# Patient Record
Sex: Male | Born: 1945 | Race: White | Hispanic: Yes | Marital: Married | Smoking: Former smoker
Health system: Southern US, Community
[De-identification: ages and names within clinical notes are randomized; demographics above are authoritative.]

## PROBLEM LIST (undated history)

## (undated) DIAGNOSIS — E785 Hyperlipidemia, unspecified: Secondary | ICD-10-CM

## (undated) DIAGNOSIS — Z87442 Personal history of urinary calculi: Secondary | ICD-10-CM

## (undated) DIAGNOSIS — N200 Calculus of kidney: Secondary | ICD-10-CM

## (undated) DIAGNOSIS — I2510 Atherosclerotic heart disease of native coronary artery without angina pectoris: Secondary | ICD-10-CM

## (undated) DIAGNOSIS — C44311 Basal cell carcinoma of skin of nose: Secondary | ICD-10-CM

## (undated) DIAGNOSIS — I10 Essential (primary) hypertension: Secondary | ICD-10-CM

## (undated) DIAGNOSIS — B029 Zoster without complications: Secondary | ICD-10-CM

## (undated) DIAGNOSIS — Z8619 Personal history of other infectious and parasitic diseases: Secondary | ICD-10-CM

## (undated) HISTORY — DX: Personal history of other infectious and parasitic diseases: Z86.19

## (undated) HISTORY — DX: Basal cell carcinoma of skin of nose: C44.311

## (undated) HISTORY — DX: Zoster without complications: B02.9

## (undated) HISTORY — DX: Personal history of urinary calculi: Z87.442

---

## 1970-03-03 HISTORY — PX: TONSILLECTOMY AND ADENOIDECTOMY: SUR1326

## 2003-03-24 ENCOUNTER — Ambulatory Visit (HOSPITAL_COMMUNITY): Admission: RE | Admit: 2003-03-24 | Discharge: 2003-03-24 | Payer: Self-pay | Admitting: Neurosurgery

## 2004-01-17 LAB — HM COLONOSCOPY: HM COLON: NORMAL

## 2004-03-26 ENCOUNTER — Ambulatory Visit: Payer: Self-pay | Admitting: Internal Medicine

## 2007-03-04 HISTORY — PX: VASECTOMY: SHX75

## 2008-05-04 ENCOUNTER — Encounter: Payer: Self-pay | Admitting: Internal Medicine

## 2009-03-03 DIAGNOSIS — B029 Zoster without complications: Secondary | ICD-10-CM

## 2009-03-03 HISTORY — DX: Zoster without complications: B02.9

## 2009-05-24 ENCOUNTER — Encounter: Payer: Self-pay | Admitting: Internal Medicine

## 2009-10-19 ENCOUNTER — Ambulatory Visit: Payer: Self-pay | Admitting: Internal Medicine

## 2009-10-19 DIAGNOSIS — B03 Smallpox: Secondary | ICD-10-CM | POA: Insufficient documentation

## 2009-10-19 DIAGNOSIS — Z87442 Personal history of urinary calculi: Secondary | ICD-10-CM

## 2009-10-19 DIAGNOSIS — B029 Zoster without complications: Secondary | ICD-10-CM | POA: Insufficient documentation

## 2009-10-19 DIAGNOSIS — C4490 Unspecified malignant neoplasm of skin, unspecified: Secondary | ICD-10-CM

## 2009-10-19 DIAGNOSIS — Z87898 Personal history of other specified conditions: Secondary | ICD-10-CM

## 2009-10-20 ENCOUNTER — Encounter: Payer: Self-pay | Admitting: Internal Medicine

## 2010-04-02 NOTE — Letter (Signed)
Summary: Generic Letter  [facility]  [address] [place], [place] [address]   Phone: [phone]  Fax: [phone]    10/20/2009  John Stein [address] [place], [place]  [address]  Dear [name],           Sincerely,   Illene Regulus MD

## 2010-04-02 NOTE — Letter (Signed)
Summary: Generic Letter  [facility]  [address] [place], [place] [address]   Phone: [phone]  Fax: [phone]          10/20/2009  John Stein [address] [place], [place]  [address]  Dear [name],  Thanks for coming to see me. I hope that your rash and sinus congestion are both improved.  I appreciate receiving the lab report. Your HDL @ 44 is good and the LDL @ 135 is very close to NCEP goal of 130 or less. Your ten year risk of a cardiac event is 12% using the NIH/Framingham risk calculator with low risk being 1-10%, moderate risk 10-20%, high risk greater than 20%. I have no recommendations except to continue gardening and exercising.   Sincerely,   Illene Regulus MD

## 2010-04-02 NOTE — Assessment & Plan Note (Signed)
Summary: PER AMY PER MD OK--COUGH FEVER CONGESTION--# PKG/OFF--STC   Vital Signs:  Patient profile:   65 year old male Height:      69 inches Weight:      177 pounds BMI:     26.23 O2 Sat:      97 % on Room air Temp:     98.2 degrees F oral Pulse rate:   76 / minute BP sitting:   120 / 78  (left arm) Cuff size:   regular  Vitals Entered By: Bill Salinas CMA (October 19, 2009 8:57 AM)  O2 Flow:  Room air CC: pt here with c/o rash on his head. productive cough x 2 weeks and sinus pressure/ ab   Primary Care Provider:  Jacques Navy MD  CC:  pt here with c/o rash on his head. productive cough x 2 weeks and sinus pressure/ ab.  History of Present Illness: Presents with a five day history of rash on the vertex scalp  which extends down the left side to behind the ear and has early erythematous papular rash on the left neck. The rash is uncomfortable. He has had shingles before in a thoracic distribution and this feels similar.  He has a 2-3 week history of cough and sinus congestion. The mucus varies from white to highly colored. He has had a chest x-ray that was read out as normal. He denies fever or chills.  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 [place] '84 Married 10 yrs - divorced; Married '90 2 sons - '93, '02; 2 daughters - '91, '96 Practicing Neurosurgeon  Review of Systems   The patient complains of prolonged cough and headaches.  The patient denies anorexia, fever, weight loss, weight gain, decreased hearing, chest pain, syncope, dyspnea on exertion, hemoptysis, abdominal pain, severe indigestion/heartburn, muscle weakness, difficulty walking, and enlarged lymph nodes.    Physical Exam  General:  Atheletic and fit appearing male in no distress Head:  normocephalic and atraumatic.  Small indentation left of center on forehead. Tender to percussion over the frontal and maxillary sinus Eyes:  pupils equal and pupils round.  C&S clear Neck:  supple.   Lungs:  normal respiratory effort, no intercostal retractions, no accessory muscle use, normal breath sounds, no dullness, no crackles, and no wheezes.  Good breath sounds throughout Heart:  normal rate and regular rhythm.   Pulses:  2+ radial, regular Neurologic:  alert & oriented X3 and gait normal.   Skin:  raised macular-papular rash left of vertex scalp with additional lesion left posterior-parietal region and extending down the left lateral neck. No open vesicles Psych:  Oriented X3, normally interactive, and good eye contact.     Impression & Recommendations:  Problem # 1:  SHINGLES, RECURRENT (ICD-053.9)  scalp rash with extension strongly suggestive of shingles. The rash is painful. He has had this for 5 days   Plan - valtrex 1000mg  three times a day x 7. He is aware of  decreased efficacy after 72 hrs of the outbreak           prednisone 20mg  once daily x 7           for persistent or worsening rash - dermatology consult  Orders: No Charge Patient Arrived (NCPA0) (NCPA0)  Problem # 2:  UNSPECIFIED SINUSITIS (ICD-473.9)  Drainage, pressure and tenderness. Lungs are very clear  Plan Augmentin 875mg  two times a day x 7        otc sudafed 30mg  two times a day        nasal saline wash  His updated medication list for this problem includes:    Amoxicillin-pot Clavulanate 875-125 Mg Tabs  (Amoxicillin-pot clavulanate) .Marland Kitchen... 1 by mouth two times a day  Orders: No Charge Patient Arrived (NCPA0) (NCPA0)  Problem # 3:  Preventive Health Care (ICD-V70.0) Appears very fit. He has had colonoscopy approx 5 years ago - normal study, thus due for follow-up at 10 years. He has had recent labs- results will be forwarded. Chart reviewed: in '06 LDL 129, HDL 38, glucose 113. He is fully immunized. He has had PSA screening.  Complete Medication List: 1)  Prednisone 20 Mg Tabs (Prednisone) .Marland Kitchen.. 1 by mouth once daily 2)  Amoxicillin-pot Clavulanate 875-125 Mg Tabs (Amoxicillin-pot clavulanate) .Marland Kitchen.. 1 by mouth two times a day 3)  Valtrex 1 Gm Tabs (Valacyclovir hcl) .Marland Kitchen.. 1 by mouth three times a day x 7 Prescriptions: VALTREX 1 GM TABS (VALACYCLOVIR HCL) 1 by mouth three times a day x 7  #21 x 0   Entered and Authorized by:   Jacques Navy MD   Signed by:   Jacques Navy MD on 10/19/2009   Method used:   Electronically to        [facility] [address] [phone]* (retail)       [address]       [place], [place]  [address]       Ph: [phone] or [phone]       Fax: [phone]   RxID:   [id number] AMOXICILLIN-POT CLAVULANATE 875-125 MG TABS (AMOXICILLIN-POT CLAVULANATE) 1 by mouth two times a day  #14 x 0   Entered and Authorized by:   Jacques Navy MD   Signed by:   Jacques Navy MD on 10/19/2009   Method used:   Electronically to        [facility] [address] [phone]* (retail)       [address]       [place], [place]  [address]       Ph: [phone] or [phone]       Fax: [phone]   RxID:   [id number] PREDNISONE 20 MG TABS (PREDNISONE) 1 by mouth once daily  #7 x 0   Entered and Authorized by:   Jacques Navy MD   Signed by:   Jacques Navy MD on 10/19/2009   Method used:   Electronically to        [facility] [address] [phone]* (retail)       [address]       [place],  [place]  [address]       Ph: [phone] or [phone]       Fax: [phone]   RxID:   [id number]

## 2011-05-14 ENCOUNTER — Ambulatory Visit: Payer: Self-pay | Admitting: Internal Medicine

## 2011-12-01 ENCOUNTER — Ambulatory Visit (INDEPENDENT_AMBULATORY_CARE_PROVIDER_SITE_OTHER): Payer: No Typology Code available for payment source | Admitting: Internal Medicine

## 2011-12-01 ENCOUNTER — Other Ambulatory Visit (INDEPENDENT_AMBULATORY_CARE_PROVIDER_SITE_OTHER): Payer: No Typology Code available for payment source

## 2011-12-01 ENCOUNTER — Encounter: Payer: Self-pay | Admitting: Internal Medicine

## 2011-12-01 VITALS — BP 142/88 | HR 79 | Temp 97.5°F | Resp 16 | Wt 185.0 lb

## 2011-12-01 DIAGNOSIS — R209 Unspecified disturbances of skin sensation: Secondary | ICD-10-CM

## 2011-12-01 DIAGNOSIS — Z125 Encounter for screening for malignant neoplasm of prostate: Secondary | ICD-10-CM

## 2011-12-01 DIAGNOSIS — Z13220 Encounter for screening for lipoid disorders: Secondary | ICD-10-CM

## 2011-12-01 DIAGNOSIS — Z0000 Encounter for general adult medical examination without abnormal findings: Secondary | ICD-10-CM | POA: Insufficient documentation

## 2011-12-01 DIAGNOSIS — B029 Zoster without complications: Secondary | ICD-10-CM

## 2011-12-01 DIAGNOSIS — M79601 Pain in right arm: Secondary | ICD-10-CM

## 2011-12-01 DIAGNOSIS — R0789 Other chest pain: Secondary | ICD-10-CM

## 2011-12-01 LAB — LIPID PANEL
Cholesterol: 223 mg/dL — ABNORMAL HIGH (ref 0–200)
HDL: 50.2 mg/dL (ref >39.00)
Triglycerides: 83 mg/dL (ref 0.0–149.0)
VLDL: 16.6 mg/dL (ref 0.0–40.0)

## 2011-12-01 LAB — COMPREHENSIVE METABOLIC PANEL
AST: 23 U/L (ref 0–37)
BUN: 13 mg/dL (ref 6–23)
Calcium: 9.2 mg/dL (ref 8.4–10.5)
Chloride: 103 mEq/L (ref 96–112)
Creatinine, Ser: 0.8 mg/dL (ref 0.4–1.5)
Total Bilirubin: 0.7 mg/dL (ref 0.3–1.2)

## 2011-12-01 LAB — LDL CHOLESTEROL, DIRECT: Direct LDL: 156.5 mg/dL

## 2011-12-01 NOTE — Assessment & Plan Note (Signed)
Interval history significant for paresthesia both UE; significant for atypical chest pain. Limited physical exam is normal. Labs pending - Lipid panel, Bmet, PSA. He is current (we think) with colorectal cancer screening and his last PSA in '11 was 1.52.  In summary - a wonderful colleague who is generally in good health. Will need risk stratification in regard to atypical chest pain and cervical imaging in regard to bilateral UE paresthesia.
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
## 2011-12-01 NOTE — Assessment & Plan Note (Signed)
discussed recommendations for immunization, even in patients with h/o shingles - he will give this consideration

## 2011-12-07 ENCOUNTER — Encounter: Payer: Self-pay | Admitting: Internal Medicine

## 2011-12-07 DIAGNOSIS — E7800 Pure hypercholesterolemia, unspecified: Secondary | ICD-10-CM

## 2011-12-09 ENCOUNTER — Encounter (HOSPITAL_COMMUNITY): Payer: No Typology Code available for payment source

## 2011-12-11 ENCOUNTER — Other Ambulatory Visit: Payer: Self-pay | Admitting: Internal Medicine

## 2011-12-11 ENCOUNTER — Encounter (HOSPITAL_COMMUNITY)
Admission: RE | Disposition: A | Discharge status: Home / Self Care | Payer: Self-pay | Source: Ambulatory Visit | Attending: Cardiovascular Disease

## 2011-12-11 ENCOUNTER — Encounter (HOSPITAL_COMMUNITY): Payer: Self-pay | Admitting: General Practice

## 2011-12-11 ENCOUNTER — Ambulatory Visit (HOSPITAL_BASED_OUTPATIENT_CLINIC_OR_DEPARTMENT_OTHER): Payer: No Typology Code available for payment source | Admitting: Radiology

## 2011-12-11 ENCOUNTER — Encounter (HOSPITAL_COMMUNITY): Payer: Self-pay | Admitting: Internal Medicine

## 2011-12-11 ENCOUNTER — Ambulatory Visit (HOSPITAL_COMMUNITY)
Admission: RE | Admit: 2011-12-11 | Discharge: 2011-12-12 | Disposition: A | Discharge status: Home / Self Care | Payer: No Typology Code available for payment source | Source: Ambulatory Visit | Attending: Cardiovascular Disease | Admitting: Cardiovascular Disease

## 2011-12-11 VITALS — BP 127/79 | Ht 68.5 in | Wt 185.0 lb

## 2011-12-11 DIAGNOSIS — I200 Unstable angina: Secondary | ICD-10-CM

## 2011-12-11 DIAGNOSIS — I2510 Atherosclerotic heart disease of native coronary artery without angina pectoris: Secondary | ICD-10-CM

## 2011-12-11 DIAGNOSIS — Z7902 Long term (current) use of antithrombotics/antiplatelets: Secondary | ICD-10-CM | POA: Insufficient documentation

## 2011-12-11 DIAGNOSIS — R072 Precordial pain: Secondary | ICD-10-CM

## 2011-12-11 DIAGNOSIS — Z23 Encounter for immunization: Secondary | ICD-10-CM | POA: Insufficient documentation

## 2011-12-11 DIAGNOSIS — R0789 Other chest pain: Secondary | ICD-10-CM

## 2011-12-11 DIAGNOSIS — E785 Hyperlipidemia, unspecified: Secondary | ICD-10-CM | POA: Insufficient documentation

## 2011-12-11 DIAGNOSIS — I209 Angina pectoris, unspecified: Secondary | ICD-10-CM | POA: Insufficient documentation

## 2011-12-11 DIAGNOSIS — R079 Chest pain, unspecified: Secondary | ICD-10-CM

## 2011-12-11 DIAGNOSIS — I10 Essential (primary) hypertension: Secondary | ICD-10-CM | POA: Insufficient documentation

## 2011-12-11 DIAGNOSIS — I4949 Other premature depolarization: Secondary | ICD-10-CM

## 2011-12-11 HISTORY — DX: Essential (primary) hypertension: I10

## 2011-12-11 HISTORY — PX: LEFT HEART CATHETERIZATION WITH CORONARY ANGIOGRAM: SHX5451

## 2011-12-11 HISTORY — DX: Hyperlipidemia, unspecified: E78.5

## 2011-12-11 HISTORY — DX: Atherosclerotic heart disease of native coronary artery without angina pectoris: I25.10

## 2011-12-11 HISTORY — DX: Calculus of kidney: N20.0

## 2011-12-11 HISTORY — PX: CORONARY ANGIOPLASTY: SHX604

## 2011-12-11 LAB — CBC
Hemoglobin: 14.7 g/dL (ref 13.0–17.0)
MCH: 31.1 pg (ref 26.0–34.0)
MCHC: 35.4 g/dL (ref 30.0–36.0)
MCV: 87.9 fL (ref 78.0–100.0)

## 2011-12-11 LAB — BASIC METABOLIC PANEL
BUN: 15 mg/dL (ref 6–23)
Calcium: 9.1 mg/dL (ref 8.4–10.5)
GFR calc non Af Amer: >90 mL/min (ref >90)
Glucose, Bld: 108 mg/dL — ABNORMAL HIGH (ref 70–99)
Potassium: 4 mEq/L (ref 3.5–5.1)

## 2011-12-11 LAB — PROTIME-INR: Prothrombin Time: 12.5 seconds (ref 11.6–15.2)

## 2011-12-11 SURGERY — LEFT HEART CATHETERIZATION WITH CORONARY ANGIOGRAM
Anesthesia: LOCAL

## 2011-12-11 MED ORDER — ATORVASTATIN CALCIUM 80 MG PO TABS
80.0000 mg | ORAL_TABLET | Freq: Every day | ORAL | Status: DC
  Filled 2011-12-11: qty 1

## 2011-12-11 MED ORDER — ASPIRIN 81 MG PO CHEW
81.0000 mg | CHEWABLE_TABLET | Freq: Every day | ORAL | Status: DC
  Administered 2011-12-12: 10:00:00 81 mg via ORAL
  Filled 2011-12-11: qty 1

## 2011-12-11 MED ORDER — LIDOCAINE HCL (PF) 1 % IJ SOLN
INTRAMUSCULAR | Status: AC
  Filled 2011-12-11: qty 30

## 2011-12-11 MED ORDER — OXYCODONE-ACETAMINOPHEN 5-325 MG PO TABS: 1.0000 | ORAL_TABLET | ORAL | Status: DC | PRN

## 2011-12-11 MED ORDER — MIDAZOLAM HCL 2 MG/2ML IJ SOLN
INTRAMUSCULAR | Status: AC
  Filled 2011-12-11: qty 2

## 2011-12-11 MED ORDER — BIVALIRUDIN 250 MG IV SOLR
INTRAVENOUS | Status: AC
  Filled 2011-12-11: qty 250

## 2011-12-11 MED ORDER — FENTANYL CITRATE 0.05 MG/ML IJ SOLN
INTRAMUSCULAR | Status: AC
  Filled 2011-12-11: qty 2

## 2011-12-11 MED ORDER — ASPIRIN 81 MG PO CHEW: 324.0000 mg | CHEWABLE_TABLET | ORAL | Status: DC

## 2011-12-11 MED ORDER — ACETAMINOPHEN 325 MG PO TABS: 650.0000 mg | ORAL_TABLET | ORAL | Status: DC | PRN

## 2011-12-11 MED ORDER — ONDANSETRON HCL 4 MG/2ML IJ SOLN: 4.0000 mg | Freq: Four times a day (QID) | INTRAMUSCULAR | Status: DC | PRN

## 2011-12-11 MED ORDER — CLOPIDOGREL BISULFATE 300 MG PO TABS
ORAL_TABLET | ORAL | Status: AC
  Filled 2011-12-11: qty 2

## 2011-12-11 MED ORDER — SODIUM CHLORIDE 0.9 % IV SOLN: INTRAVENOUS | Status: AC

## 2011-12-11 MED ORDER — CLOPIDOGREL BISULFATE 75 MG PO TABS
75.0000 mg | ORAL_TABLET | Freq: Every day | ORAL | Status: DC
  Administered 2011-12-12: 10:00:00 75 mg via ORAL
  Filled 2011-12-11: qty 1

## 2011-12-11 MED ORDER — VERAPAMIL HCL 2.5 MG/ML IV SOLN
INTRAVENOUS | Status: AC
  Filled 2011-12-11: qty 2

## 2011-12-11 MED ORDER — TECHNETIUM TC 99M SESTAMIBI GENERIC - CARDIOLITE
33.0000 | Freq: Once | INTRAVENOUS | Status: AC | PRN
  Administered 2011-12-11: 33 via INTRAVENOUS

## 2011-12-11 MED ORDER — HEPARIN SODIUM (PORCINE) 1000 UNIT/ML IJ SOLN
INTRAMUSCULAR | Status: AC
  Filled 2011-12-11: qty 1

## 2011-12-11 MED ORDER — TECHNETIUM TC 99M SESTAMIBI GENERIC - CARDIOLITE
11.0000 | Freq: Once | INTRAVENOUS | Status: AC | PRN
  Administered 2011-12-11: 11 via INTRAVENOUS

## 2011-12-11 MED ORDER — NITROGLYCERIN 0.2 MG/ML ON CALL CATH LAB
INTRAVENOUS | Status: AC
  Filled 2011-12-11: qty 1

## 2011-12-11 MED ORDER — DIAZEPAM 5 MG PO TABS
5.0000 mg | ORAL_TABLET | ORAL | Status: AC
  Administered 2011-12-11: 5 mg via ORAL
  Filled 2011-12-11: qty 1

## 2011-12-11 MED ORDER — SODIUM CHLORIDE 0.9 % IV SOLN
INTRAVENOUS | Status: DC
  Administered 2011-12-11: 13:00:00 via INTRAVENOUS

## 2011-12-11 NOTE — Interval H&P Note (Signed)
History and Physical Interval Note:  12/11/2011 5:33 PM  John Stein  has presented today for surgery, with the diagnosis of chest pain  The various methods of treatment have been discussed with the patient and family. After consideration of risks, benefits and other options for treatment, the patient has consented to  Procedure(s) (LRB) with comments: LEFT HEART CATHETERIZATION WITH CORONARY ANGIOGRAM (N/A) as a surgical intervention .  The patient's history has been reviewed, patient examined, no change in status, stable for surgery.  I have reviewed the patient's chart and labs.  Questions were answered to the patient's satisfaction.     Tonny Bollman

## 2011-12-11 NOTE — Progress Notes (Signed)
 History and Physical  Patient ID: John Stein MRN: [id number], SOB: 09/19/1945 66 y.o. Date of Encounter: 12/11/2011, 12:08 PM  Primary Physician: Michael Norins, MD Primary Cardiologist:  new  Chief Complaint: abnormal stress  History of Present Illness: Malakhi Sagona is a 66 y.o. male  seen following abnormal stress test this morning. He has been having for about a year and more recently of late chest tightness which has been increasingly heavy. There has been radiation into his jaw. It occasionally occurs with exertion but also with stress particularly in the operating room.  Has been associated with some diaphoresis.  His no family history. Lipids last week  demonstrated an LDL of 156 with an HDL of 50. He does not smoke.  Past Medical History  Diagnosis Date  . 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 Shingles rash 2011    right scalp  . History of nephrolithiasis   . History of smallpox childhood     Past Surgical History  Procedure Date  . Tonsillectomy remote  . Adenoidectomy remote      No current outpatient prescriptions on file.   No current facility-administered medications for this visit.   Facility-Administered Medications Ordered in Other Visits  Medication Dose Route Frequency Provider Last Rate Last Dose  . technetium sestamibi generic (CARDIOLITE) injection 11 milli Curie  11 milli Curie Intravenous Once PRN Dalton S McLean, MD   11 milli Curie at 12/11/11 0825  . technetium sestamibi generic (CARDIOLITE) injection 33 milli Curie  33 milli Curie Intravenous Once PRN Dalton S McLean, MD   33 milli Curie at 12/11/11 1000     Allergies: No Known Allergies   History  Substance Use Topics  . Smoking status: Never Smoker   . Smokeless tobacco: Never Used  . Alcohol Use: Yes      Family History  Problem Relation Age of Onset  . Diabetes Neg Hx   . Hypertension Neg Hx   . Cancer Neg Hx       ROS:  Please see the history of present    All other systems reviewed and negative.   Vital Signs: There were no vitals taken for this visit. Vital signs demonstrated a blood pressure of 127/79 and a heart rate of 62  PHYSICAL EXAM: General:  Well nourished, well developed male in no acute distress  HEENT: normal Lymph: no adenopathy Neck: no JVD Endocrine:  No thryomegaly Vascular: No carotid bruits; FA pulses 2+ bilaterally without bruits Cardiac:  normal S1, S2; RRR; no murmur Back: without kyphosis/scoliosis, no CVA tenderness Lungs:  clear to auscultation bilaterally, no wheezing, rhonchi or rales Abd: soft, nontender, no hepatomegaly Ext: no edema Musculoskeletal:  No deformities, BUE and BLE strength normal and equal Skin: warm and dry Neuro:  CNs 2-12 intact, no focal abnormalities noted Psych:  Normal affect   EKG:   Sinus rhythm at 62 intervals 16/08/40 Otherwise normal  Treadmill demonstrated 2-3 mm of ST segment depression inferolateral that resolved with axis  Myoview pictures demonstrated anterolateral dropout of perfusion  Labs:   No results found for this basename: WBC, HGB, HCT, MCV, PLT   No results found for this basename: NA,K,CL,CO2,BUN,CREATININE,CALCIUM,LABALBU,PROT,BILITOT,ALKPHOS,ALT,AST,GLUCOSE in the last 168 hours No results found for this basename: CKTOTAL:4,CKMB:4,TROPONINI:4 in the last 72 hours Lab Results  Component Value Date   CHOL 223* 12/01/2011   HDL 50.20 12/01/2011   TRIG 83.0 12/01/2011   No results found for this basename: DDIMER   BNP No   results found for this basename: probnp       ASSESSMENT AND PLAN:   Abnormal stress test; the patient presented with chest pain. He has elevated lipids. Underwent Myoview scanning today which was markedly abnormal. I have reviewed the above results with the patient. We will plan to refer to the hospital for catheterization and likely intervention. We will give him aspirin. He will need statin therapy for secondary prevention.    

## 2011-12-11 NOTE — H&P (View-Only) (Signed)
History and Physical  Patient ID: John Stein MRN: [id number], SOB: Oct 21, 1945 66 y.o. Date of Encounter: 12/11/2011, 12:08 PM  Primary Physician: Illene Regulus, MD Primary Cardiologist:  new  Chief Complaint: abnormal stress  History of Present Illness: John Stein is a 66 y.o. male  seen following abnormal stress test this morning. He has been having for about a year and more recently of late chest tightness which has been increasingly heavy. There has been radiation into his jaw. It occasionally occurs with exertion but also with stress particularly in the operating room.  Has been associated with some diaphoresis.  His no family history. Lipids last week  demonstrated an LDL of 156 with an HDL of 50. He does not smoke.  Past Medical History  Diagnosis Date  . 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 Shingles rash 2011    right scalp  . History of nephrolithiasis   . History of smallpox childhood     Past Surgical History  Procedure Date  . Tonsillectomy remote  . Adenoidectomy remote      No current outpatient prescriptions on file.   No current facility-administered medications for this visit.   Facility-Administered Medications Ordered in Other Visits  Medication Dose Route Frequency Provider Last Rate Last Dose  . technetium sestamibi generic (CARDIOLITE) injection 11 milli Curie  11 milli Curie Intravenous Once PRN Laurey Morale, MD   11 milli Curie at 12/11/11 0825  . technetium sestamibi generic (CARDIOLITE) injection 33 milli Curie  33 milli Curie Intravenous Once PRN Laurey Morale, MD   33 milli Curie at 12/11/11 1000     Allergies: No Known Allergies   History  Substance Use Topics  . Smoking status: Never Smoker   . Smokeless tobacco: Never Used  . Alcohol Use: Yes      Family History  Problem Relation Age of Onset  . Diabetes Neg Hx   . Hypertension Neg Hx   . Cancer Neg Hx       ROS:  Please see the history of present    All other systems reviewed and negative.   Vital Signs: There were no vitals taken for this visit. Vital signs demonstrated a blood pressure of 127/79 and a heart rate of 62  PHYSICAL EXAM: General:  Well nourished, well developed male in no acute distress  HEENT: normal Lymph: no adenopathy Neck: no JVD Endocrine:  No thryomegaly Vascular: No carotid bruits; FA pulses 2+ bilaterally without bruits Cardiac:  normal S1, S2; RRR; no murmur Back: without kyphosis/scoliosis, no CVA tenderness Lungs:  clear to auscultation bilaterally, no wheezing, rhonchi or rales Abd: soft, nontender, no hepatomegaly Ext: no edema Musculoskeletal:  No deformities, BUE and BLE strength normal and equal Skin: warm and dry Neuro:  CNs 2-12 intact, no focal abnormalities noted Psych:  Normal affect   EKG:   Sinus rhythm at 62 intervals 16/08/40 Otherwise normal  Treadmill demonstrated 2-3 mm of ST segment depression inferolateral that resolved with axis  Myoview pictures demonstrated anterolateral dropout of perfusion  Labs:   No results found for this basename: WBC, HGB, HCT, MCV, PLT   No results found for this basename: NA,K,CL,CO2,BUN,CREATININE,CALCIUM,LABALBU,PROT,BILITOT,ALKPHOS,ALT,AST,GLUCOSE in the last 168 hours No results found for this basename: CKTOTAL:4,CKMB:4,TROPONINI:4 in the last 72 hours Lab Results  Component Value Date   CHOL 223* 12/01/2011   HDL 50.20 12/01/2011   TRIG 83.0 12/01/2011   No results found for this basename: DDIMER   BNP No  results found for this basename: probnp       ASSESSMENT AND PLAN:   Abnormal stress test; the patient presented with chest pain. He has elevated lipids. Underwent Myoview scanning today which was markedly abnormal. I have reviewed the above results with the patient. We will plan to refer to the hospital for catheterization and likely intervention. We will give him aspirin. He will need statin therapy for secondary prevention.

## 2011-12-11 NOTE — CV Procedure (Signed)
Cardiac Catheterization Procedure Note  Name: John Stein MRN: [id number] DOB: December 28, 1945  Procedure: Left Heart Cath, Selective Coronary Angiography, LV angiography, PTCA of the first diagonal branch of the LAD  Indication: Class 3 angina  Procedural Details:  The right wrist was prepped, draped, and anesthetized with 1% lidocaine. Using the modified Seldinger technique, a 5 French sheath was introduced into the right radial artery. 3 mg of verapamil was administered through the sheath, weight-based unfractionated heparin was administered intravenously. Standard Judkins catheters were used for selective coronary angiography and left ventriculography. Catheter exchanges were performed over an exchange length guidewire.  PROCEDURAL FINDINGS Hemodynamics: AO 144/80 LV 140/22   Coronary angiography: Coronary dominance: right  Left mainstem: Widely patent without obstructive disease  Left anterior descending (LAD): The LAD has proximal ectasia. There is no significant stenosis. The mid vessel at the origin of the first diagonal has 20-30% stenosis. There are diffuse irregularities throughout the remaining portions of the LAD. The first diagonal is large in caliber and it is subtotally occluded with a 99% ostial stenosis. The distal branch vessels of the diagonal fills competitively from antegrade and retrograde collateral flow from the right coronary artery.  Left circumflex (LCx): There is a small intermediate branch with no significant stenosis. The AV groove circumflex is widely patent in the proximal and mid portions. The vessel supplies a large atrial branch. Beyond the atrial branch and the first obtuse marginal there is 40% stenosis at the origin of the OM and then 60% stenosis in the mid body of the OM.  Right coronary artery (RCA): This is a large, dominant vessel. There is diffuse nonobstructive disease throughout. The vessel is widely patent through the proximal, mid, and distal  portions. The PDA is very large without significant stenosis. There are 2 small posterolateral branches without significant disease. There are collaterals supplying the distal branch vessels of the first diagonal peer  Left ventriculography: Left ventricular systolic function is preserved. The estimated left ventricular ejection fraction is 55%. There is subtle hypokinesis of the distal anterolateral wall.  PCI Note:  Following the diagnostic procedure, the decision was made to proceed with PCI. The radial sheath was upsized to a 6 [place]. Weight-based bivalirudin was given for anticoagulation. The patient was loaded with 600 mg of Plavix. Once a therapeutic ACT was achieved, a 6 [place] XB LAD 3.5 cm guide catheter was inserted.  A cougar coronary guidewire was initially used but I was unable to cross the lesion. I changed out to a fielder XT wire. This was successful at crossing the lesion.  The lesion was predilated with a 2.5 x 12 mm balloon.  The lesion was then dilated with a 2.5 x 10 mm cutting balloon.  Because ostial location of the lesion and the angulation as it originates from the LAD, I did not feel that stenting was an option.  Following PCI, there was 30% residual stenosis and TIMI-3 flow. Final angiography confirmed an excellent result. The patient tolerated the procedure well. There were no immediate procedural complications. A TR band was used for radial hemostasis. The patient was transferred to the post catheterization recovery area for further monitoring.  PCI Data: Vessel -  diagonal 1 /Segment -  ostial  Percent Stenosis (pre)   99  TIMI-flow  3 Stent  none  Percent Stenosis (post)  30  TIMI-flow (post)  3   Final Conclusions:   1. Severe diagonal stenosis treated successfully with balloon angioplasty 2. Moderate LCx stenosis 3. Mild  nonobstructive RCA and LAD stenoses 4. Preserved LV function  Recommendations:  Aggressive medical therapy.  Tonny Bollman 12/11/2011,  7:04 PM

## 2011-12-11 NOTE — Progress Notes (Addendum)
TR BAND REMOVAL  LOCATION:  right radial  DEFLATED PER PROTOCOL:  yes  TIME BAND OFF / DRESSING APPLIED:   2200  SITE UPON ARRIVAL:   Level 1  SITE AFTER BAND REMOVAL:  Level 1  REVERSE ALLEN'S TEST:    positive  CIRCULATION SENSATION AND MOVEMENT:  Within Normal Limits  yes  COMMENTS:  Pressure held for swelling

## 2011-12-11 NOTE — Progress Notes (Signed)
[facility] SITE 3 NUCLEAR MED [id number] [place] [address] [phone]  Cardiology Nuclear Med Study  John Stein is a 66 y.o. male     MRN : [id number]     DOB: 08/30/1945  Procedure Date: 12/11/2011  Nuclear Med Background Indication for Stress Test:  Evaluation for Ischemia History:  n/a Cardiac Risk Factors: Lipids  Symptoms:  Chest Pain and Palpitations   Nuclear Pre-Procedure Caffeine/Decaff Intake:  None > 12 hrs NPO After: 10:00pm   Lungs:  clear O2 Sat: 95% on room air. IV 0.9% NS with Angio Cath:  22g  IV Site: R Antecubital x 1, tolerated well IV Started by:  Irean Hong, RN  Chest Size (in):  42 Cup Size: n/a  Height: 5' 8.5" (1.74 m)  Weight:  185 lb (83.915 kg)  BMI:  Body mass index is 27.72 kg/(m^2). Tech Comments:  This patient walked on the treadmill for 12 minutes. He started having chest tightness, PVCS, and a very positive EKG. DOD S.Graciela Husbands was consulted and saw the patient today. He was sent for a cath today for further evaluation.    Nuclear Med Study 1 or 2 day study: 1 day  Stress Test Type:  Stress  Reading MD: Marca Ancona, MD  Order Authorizing Provider:  Illene Regulus, MD  Resting Radionuclide: Technetium 34m Sestamibi  Resting Radionuclide Dose: 11.0 mCi   Stress Radionuclide:  Technetium 41m Sestamibi  Stress Radionuclide Dose: 33.0 mCi           Stress Protocol Rest HR: 62 Stress HR: 134  Rest BP: 127/79 Stress BP: 183/83  Exercise Time (min): 12:00 METS: 13.40   Predicted Max HR: 155 bpm % Max HR: 86.45 bpm Rate Pressure Product: 62952   Dose of Adenosine (mg):  n/a Dose of Lexiscan: n/a mg  Dose of Atropine (mg): n/a Dose of Dobutamine: n/a mcg/kg/min (at max HR)  Stress Test Technologist: Milana Na, EMT-P  Nuclear Technologist:  Domenic Polite, CNMT     Rest Procedure:  Myocardial perfusion imaging was performed at rest 45 minutes following the intravenous administration of  Technetium 70m Sestamibi. Rest ECG: NSR - Normal EKG  Stress Procedure:  The patient performed treadmill exercise using a Bruce  Protocol for 12:00 minutes. The patient stopped due to fatigue and chest tightness that radiated to his (L) jaw.  There were + significant ST-T wave changes and freq pvcs.  Technetium 3m Tetrofosmin was injected at peak exercise and myocardial perfusion imaging was performed after a brief delay. Stress ECG: Significant ST abnormalities consistent with ischemia.  QPS Raw Data Images:  Normal; no motion artifact; normal heart/lung ratio. Stress Images:  Medium, severe anterior and mid to apical anterolateral perfusion defect.  Rest Images:  Normal homogeneous uptake in all areas of the myocardium. Subtraction (SDS):  Medium, severe reversible anterior and mid to apical anterolateral perfusion defect.  Transient Ischemic Dilatation (Normal <1.22):  1.07 Lung/Heart Ratio (Normal <0.45):  0.31  Quantitative Gated Spect Images QGS EDV:  139 ml QGS ESV:  65 ml  Impression Exercise Capacity:  Excellent exercise capacity. BP Response:  Normal blood pressure response. Clinical Symptoms:  Significant chest pain. ECG Impression:  3 mm horizontal ST depression V4-V6 persisting up to 7 minutes in recovery.  Some PVCs.  Comparison with Prior Nuclear Study: No images to compare  Overall Impression:  High risk stress nuclear study.  LV Ejection Fraction: 53%.  LV Wall Motion:  Mild apical anterior and lateral  hypokinesis.   Patient admitted for cath.   Marca Ancona 12/11/2011

## 2011-12-12 ENCOUNTER — Encounter (HOSPITAL_COMMUNITY): Payer: Self-pay | Admitting: Nurse Practitioner

## 2011-12-12 DIAGNOSIS — I200 Unstable angina: Secondary | ICD-10-CM

## 2011-12-12 DIAGNOSIS — E785 Hyperlipidemia, unspecified: Secondary | ICD-10-CM

## 2011-12-12 DIAGNOSIS — I10 Essential (primary) hypertension: Secondary | ICD-10-CM | POA: Diagnosis present

## 2011-12-12 DIAGNOSIS — I2510 Atherosclerotic heart disease of native coronary artery without angina pectoris: Secondary | ICD-10-CM

## 2011-12-12 LAB — CBC
MCH: 31 pg (ref 26.0–34.0)
MCV: 86.7 fL (ref 78.0–100.0)
Platelets: 145 10*3/uL — ABNORMAL LOW (ref 150–400)
RDW: 12.6 % (ref 11.5–15.5)
WBC: 5.3 10*3/uL (ref 4.0–10.5)

## 2011-12-12 LAB — BASIC METABOLIC PANEL
Calcium: 9 mg/dL (ref 8.4–10.5)
Creatinine, Ser: 0.81 mg/dL (ref 0.50–1.35)
GFR calc Af Amer: >90 mL/min (ref >90)
GFR calc non Af Amer: >90 mL/min (ref >90)

## 2011-12-12 MED ORDER — METOPROLOL SUCCINATE ER 25 MG PO TB24: 25.0000 mg | ORAL_TABLET | Freq: Every day | ORAL | Status: DC

## 2011-12-12 MED ORDER — ATORVASTATIN CALCIUM 80 MG PO TABS: 80.0000 mg | ORAL_TABLET | Freq: Every day | ORAL | Status: DC

## 2011-12-12 MED ORDER — METOPROLOL SUCCINATE ER 25 MG PO TB24
25.0000 mg | ORAL_TABLET | Freq: Every day | ORAL | Status: DC
  Administered 2011-12-12: 25 mg via ORAL
  Filled 2011-12-12 (×2): qty 1

## 2011-12-12 MED ORDER — CLOPIDOGREL BISULFATE 75 MG PO TABS: 75.0000 mg | ORAL_TABLET | Freq: Every day | ORAL | Status: DC

## 2011-12-12 MED ORDER — ASPIRIN 81 MG PO CHEW: 81.0000 mg | CHEWABLE_TABLET | Freq: Every day | ORAL | Status: AC

## 2011-12-12 MED ORDER — NITROGLYCERIN 0.4 MG SL SUBL: 0.4000 mg | SUBLINGUAL_TABLET | SUBLINGUAL | Status: DC | PRN

## 2011-12-12 MED ORDER — PNEUMOCOCCAL VAC POLYVALENT 25 MCG/0.5ML IJ INJ
0.5000 mL | INJECTION | Freq: Once | INTRAMUSCULAR | Status: AC
  Administered 2011-12-12: 0.5 mL via INTRAMUSCULAR
  Filled 2011-12-12 (×2): qty 0.5

## 2011-12-12 MED FILL — Dextrose Inj 5%: INTRAVENOUS | Qty: 50 | Status: AC

## 2011-12-12 NOTE — Progress Notes (Signed)
CARDIAC REHAB PHASE I   PRE:  Rate/Rhythm: 66 SR    BP: sitting 130/90    SaO2: 97 RA  MODE:  Ambulation: 1000 ft   POST:  Rate/Rhythm: 80    BP: sitting 127/84     SaO2: 99 RA  Tolerated well with quick pace. BP up in bed, actually better after walk, see above. Ed completed. Unable to do CRPII due to work.  [id number]  Harriet Masson CES, ACSM

## 2011-12-12 NOTE — Progress Notes (Signed)
    Subjective:  No chest pain or dyspnea.   Objective:  Vital Signs in the last 24 hours: Temp:  [97.7 F (36.5 C)-98 F (36.7 C)] 97.8 F (36.6 C) (10/11 0547) Pulse Rate:  [55-76] 57  (10/11 0547) Resp:  [16-25] 16  (10/11 0547) BP: (118-148)/(50-87) 128/65 mmHg (10/11 0547) SpO2:  [95 %-100 %] 96 % (10/11 0547) Weight:  [82.101 kg (181 lb)-84.9 kg (187 lb 2.7 oz)] 84.9 kg (187 lb 2.7 oz) (10/11 0028)  Intake/Output from previous day: 10/10 0701 - 10/11 0700 In: 582.5 [I.V.:582.5] Out: 800 [Urine:800]  Physical Exam: Pt is alert and oriented, NAD HEENT: normal Neck: JVP - normal, carotids 2+= without bruits Lungs: CTA bilaterally CV: RRR without murmur or gallop Abd: soft, NT, Positive BS, no hepatomegaly Ext: no C/C/E, distal pulses intact and equal. Right radial site ok. Skin: warm/dry no rash  Lab Results:  Basename 12/12/11 0605 12/11/11 1304  WBC 5.3 5.1  HGB 14.0 14.7  PLT 145* 166    Basename 12/12/11 0605 12/11/11 1304  NA 140 139  K 3.6 4.0  CL 105 103  CO2 27 26  GLUCOSE 98 108*  BUN 13 15  CREATININE 0.81 0.73   No results found for this basename: TROPONINI:2,CK,MB:2 in the last 72 hours  Tele: sinus rhythm  Assessment/Plan:  1. CAD, native vessel - Class 3 angina now s/p PCI (POBA - D1) 2. Hyperlipidemia - start lipitor 80 mg 3. HTN - start Toprol XL 25 mg daily. 4. Home this am - f/u with me 2 weeks  Tonny Bollman, M.D. 12/12/2011, 8:28 AM

## 2011-12-12 NOTE — Discharge Summary (Signed)
Patient ID: John Stein,  MRN: [id number], DOB/AGE: Dec 26, 1945 66 y.o.  Admit date: 12/11/2011 Discharge date: 12/12/2011  Primary Care Provider: Illene Regulus Primary Cardiologist: Judie Petit. Excell Seltzer, MD  Discharge Diagnoses Principal Problem:  *Unstable angina  **s/p PTCA of the D1 this admission. Active Problems:  Coronary atherosclerosis of native coronary artery  Hyperlipidemia  **Statin therapy initiated this admission.  Hypertension  Allergies No Known Allergies  Procedures  Cardiac Catheterization and Percutaneous Coronary Intervention 12/11/2011  Hemodynamics: AO 144/80 LV 140/22              Coronary angiography: Coronary dominance: right  Left mainstem: Widely patent without obstructive disease Left anterior descending (LAD): The LAD has proximal ectasia. There is no significant stenosis. The mid vessel at the origin of the first diagonal has 20-30% stenosis. There are diffuse irregularities throughout the remaining portions of the LAD. The first diagonal is large in caliber and it is subtotally occluded with a 99% ostial stenosis. The distal branch vessels of the diagonal fills competitively from antegrade and retrograde collateral flow from the right coronary artery.   **The D1 was treated with PTCA only**  Left circumflex (LCx): There is a small intermediate branch with no significant stenosis. The AV groove circumflex is widely patent in the proximal and mid portions. The vessel supplies a large atrial branch. Beyond the atrial branch and the first obtuse marginal there is 40% stenosis at the origin of the OM and then 60% stenosis in the mid body of the OM. Right coronary artery (RCA): This is a large, dominant vessel. There is diffuse nonobstructive disease throughout. The vessel is widely patent through the proximal, mid, and distal portions. The PDA is very large without significant stenosis. There are 2 small posterolateral branches without significant disease.  There are collaterals supplying the distal branch vessels of the first diagonal peer  Left ventriculography: Left ventricular systolic function is preserved. The estimated left ventricular ejection fraction is 55%. There is subtle hypokinesis of the distal anterolateral wall. _____________  History of Present Illness  66 y/o male without prior cardiac history who was recently evaluated by primary care for complaints of intermittent chest pain not relieved with antacids.  He was scheduled for an exercise myoview @ [place], which took place on 12/11/2011.  During exercise, pt developed 3mm ST depression in leads V4-V6 and myoview imaging revealied anterior and anterolateral ischemia.  Given these high-risk finding, pt was admitted to [facility] for further evaluation.  Hospital Course  Pt underwent diagnostic catheterization on 12/11/2011 revealing severe 99% ostial stenosis of the first diagonal branch of the LAD.  He otherwise had nonobstructive disease.  The D1 was successfully treated with balloon angioplasty with reduction in stenosis from 99% to 30% with resultant TIMI 3 flow.  Because of the ostial nature of the stenosis and the angulation of the vessel origination off of the LAD, it was not felt that stenting was an option.  Pt tolerated procedure well and post-procedure has had no recurrent chest pain.  He has been seen by cardiac rehab and has been ambulating without difficulty.  He has been placed on asa, plavix, statin, and beta-blocker therapy and will be discharged home today in good condition.  Discharge Vitals Blood pressure 127/84, pulse 63, temperature 97.7 F (36.5 C), temperature source Oral, resp. rate 17, height 5' 8.5" (1.74 m), weight 187 lb 2.7 oz (84.9 kg), SpO2 96.00%.  Filed Weights   12/11/11 1256 12/12/11 0028  Weight: 181 lb (82.101 kg)  187 lb 2.7 oz (84.9 kg)    Labs  CBC  Basename 12/12/11 0605 12/11/11 1304  WBC 5.3 5.1  NEUTROABS -- --  HGB 14.0  14.7  HCT 39.1 41.5  MCV 86.7 87.9  PLT 145* 166   Basic Metabolic Panel  Basename 12/12/11 0605 12/11/11 1304  NA 140 139  K 3.6 4.0  CL 105 103  CO2 27 26  GLUCOSE 98 108*  BUN 13 15  CREATININE 0.81 0.73  CALCIUM 9.0 9.1  MG -- --  PHOS -- --   Disposition  Pt is being discharged home today in good condition.  Follow-up Plans & Appointments      Follow-up Information    Follow up with Tonny Bollman, MD. On 12/24/2011. (9:00 AM)    Contact information:   [address] [place] [address] [phone]       Follow up with Illene Regulus, MD. (as scheduled)    Contact information:   [address] [place] [address] [phone]        Discharge Medications    Medication List     As of 12/12/2011 10:09 AM    TAKE these medications         aspirin 81 MG chewable tablet   Chew 1 tablet (81 mg total) by mouth daily.      atorvastatin 80 MG tablet   Commonly known as: LIPITOR   Take 1 tablet (80 mg total) by mouth daily at 6 PM.      clopidogrel 75 MG tablet   Commonly known as: PLAVIX   Take 1 tablet (75 mg total) by mouth daily with breakfast.      metoprolol succinate 25 MG 24 hr tablet   Commonly known as: TOPROL-XL   Take 1 tablet (25 mg total) by mouth daily.      nitroGLYCERIN 0.4 MG SL tablet   Commonly known as: NITROSTAT   Place 1 tablet (0.4 mg total) under the tongue every 5 (five) minutes as needed for chest pain.       Outstanding Labs/Studies  He will require follow-up lipids and LFT's in 8 weeks (statin started this admission)  Duration of Discharge Encounter   Greater than 30 minutes including physician time.  Signed, Nicolasa Ducking NP 12/12/2011, 10:09 AM

## 2011-12-12 NOTE — Progress Notes (Signed)
COURTESY  Chart reviewed, spoke with Dr. Jeral Fruit: atypical angina leading to abnormal stress test to cath with multi-vessel non-obstructive disease with 99% ostial OM to PCI w/o stent. He feels good this AM.  Plan- aggressive cardiac risk reduction: will need "statin" therapy - started on Atorvastatin 80 mg daily. Will benefit from Beta-blocker trial due to BP excursions to the 140s, if this is not tolerated an ARB would be a good fall back.  Happy to help any way possible in his on-going care.   Thanks for the great care.

## 2011-12-24 ENCOUNTER — Encounter: Payer: Self-pay | Admitting: Cardiovascular Disease

## 2011-12-24 ENCOUNTER — Ambulatory Visit (INDEPENDENT_AMBULATORY_CARE_PROVIDER_SITE_OTHER): Payer: No Typology Code available for payment source | Admitting: Cardiovascular Disease

## 2011-12-24 VITALS — BP 132/70 | HR 70 | Ht 68.0 in | Wt 179.0 lb

## 2011-12-24 DIAGNOSIS — I2510 Atherosclerotic heart disease of native coronary artery without angina pectoris: Secondary | ICD-10-CM

## 2011-12-24 NOTE — Patient Instructions (Addendum)
Your physician wants you to follow-up in: 6 MONTHS WITH DR Theodoro Parma will receive a reminder letter in the mail two months in advance. If you don't receive a letter, please call our office to schedule the follow-up appointment.   Your physician recommends that you return for lab work in: 2 MONTHS=FASTING  TAKE PLAVIX FOR 3 MONTHS THEN CAN STOP

## 2011-12-24 NOTE — Progress Notes (Signed)
   HPI:  66 year old gentleman presenting for hospital followup evaluation. The patient was recently hospitalized after a high-risk stress test. He underwent balloon angioplasty of severe stenosis in the ostial diagonal branch. He was experiencing CCS class III anginal symptoms. He presents today for followup evaluation. During his hospital stay, he was started on a statin drug and the beta blocker.  Overall he feels well. His energy level is good. He has had no recurrent anginal symptoms. He is walking up to 3 miles daily without exertional chest pain or pressure. He denies dyspnea, palpitations, or edema. He's had no bleeding problems.  Outpatient Encounter Prescriptions as of 12/24/2011  Medication Sig Dispense Refill  . aspirin 81 MG chewable tablet Chew 1 tablet (81 mg total) by mouth daily.      Marland Kitchen atorvastatin (LIPITOR) 80 MG tablet Take 1 tablet (80 mg total) by mouth daily at 6 PM.  30 tablet  6  . clopidogrel (PLAVIX) 75 MG tablet Take 1 tablet (75 mg total) by mouth daily with breakfast.  30 tablet  6  . metoprolol succinate (TOPROL-XL) 25 MG 24 hr tablet Take 1 tablet (25 mg total) by mouth daily.  30 tablet  6  . nitroGLYCERIN (NITROSTAT) 0.4 MG SL tablet Place 1 tablet (0.4 mg total) under the tongue every 5 (five) minutes as needed for chest pain.  25 tablet  3    No Known Allergies  Past Medical History  Diagnosis Date  . 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 Shingles rash 2011    right scalp  . History of nephrolithiasis   . History of smallpox childhood  . Kidney stone ~ 2003  . CAD (coronary artery disease)     a. 12/11/2011 Ex MV: Med/Sev ant & antlat ischemia, EF 53%, 3mm ST dep V4-V6;  b. 12/11/2011 Cath/PTCA: LM nl, LAD 20-24m, D1 99ost (Tx w/ PTCA only), LCX 40/2m, RCA diff nonobs dzs, EF 55%, sublte dist antlat HK.    Marland Kitchen Hyperlipidemia   . HTN (hypertension)     ROS: Negative except as per HPI  BP 132/70  Pulse 70  Ht 5\' 8"  (1.727 m)  Wt 81.194 kg (179  lb)  BMI 27.22 kg/m2  PHYSICAL EXAM: Pt is alert and oriented, NAD HEENT: normal Neck: JVP - normal, carotids 2+= without bruits Lungs: CTA bilaterally CV: RRR without murmur or gallop Abd: soft, NT, Positive BS, no hepatomegaly Ext: no C/C/E, distal pulses intact and equal Skin: warm/dry no rash  EKG:  Normal sinus rhythm 70 beats per minute, minimal voltage criteria for LVH maybe normal variant.  ASSESSMENT AND PLAN: 1. Coronary artery disease, native vessel. The patient has had no recurrent angina since his PCI procedure. He will continue dual antiplatelet therapy with aspirin and Plavix. I would like him to complete 3 months of Plavix, then he can discontinue this. He should remain on aspirin 81 mg indefinitely. Secondary risk reduction measures as outlined below.  2. Hyperlipidemia. The patient has been started on a high-dose statin drug. He has a significant athero-burden and should be treated aggressively. Will followup with lipids and LFTs in 2 months. Lipids before he was started on a statin drug showed a cholesterol of 223, triglycerides 83, HDL 50, and LDL 157.  Tonny Bollman 12/24/2011 9:50 AM

## 2012-06-02 ENCOUNTER — Ambulatory Visit (INDEPENDENT_AMBULATORY_CARE_PROVIDER_SITE_OTHER): Payer: 59 | Admitting: Cardiovascular Disease

## 2012-06-02 ENCOUNTER — Encounter: Payer: Self-pay | Admitting: Cardiovascular Disease

## 2012-06-02 VITALS — BP 128/78 | HR 72 | Ht 68.0 in | Wt 178.0 lb

## 2012-06-02 DIAGNOSIS — E7800 Pure hypercholesterolemia, unspecified: Secondary | ICD-10-CM

## 2012-06-02 DIAGNOSIS — I2510 Atherosclerotic heart disease of native coronary artery without angina pectoris: Secondary | ICD-10-CM

## 2012-06-02 LAB — LIPID PANEL
HDL: 43.4 mg/dL (ref >39.00)
Total CHOL/HDL Ratio: 3
VLDL: 22 mg/dL (ref 0.0–40.0)

## 2012-06-02 LAB — HEPATIC FUNCTION PANEL
Bilirubin, Direct: 0.2 mg/dL (ref 0.0–0.3)
Total Bilirubin: 1.2 mg/dL (ref 0.3–1.2)

## 2012-06-02 NOTE — Progress Notes (Signed)
   HPI:  67 year old gentleman presenting for followup evaluation. 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 He was hospitalized after a high-risk stress test. He underwent balloon angioplasty of severe stenosis in the ostium of the first diagonal branch. That time he was experiencing CCS class III anginal symptoms. He is now 6 months out from his intervention.  He remains very active. He has no anginal symptoms with physical exertion. He continues to have some chest discomfort with stress in the operating room. He has taken a few nitroglycerin and this provides relief. He denies dyspnea, edema, orthopnea, or PND. He has occasional palpitations. He reports no bleeding problems with Plavix. He does complain of muscle aches in the thighs, especially with walking up stairs. He attributes this to his statin drug.  Outpatient Encounter Prescriptions as of 06/02/2012  Medication Sig Dispense Refill  . aspirin 81 MG chewable tablet Chew 1 tablet (81 mg total) by mouth daily.      Marland Kitchen atorvastatin (LIPITOR) 80 MG tablet Take 1 tablet (80 mg total) by mouth daily at 6 PM.  30 tablet  6  . clopidogrel (PLAVIX) 75 MG tablet Take 1 tablet (75 mg total) by mouth daily with breakfast.  30 tablet  6  . metoprolol succinate (TOPROL-XL) 25 MG 24 hr tablet Take 1 tablet (25 mg total) by mouth daily.  30 tablet  6  . nitroGLYCERIN (NITROSTAT) 0.4 MG SL tablet Place 1 tablet (0.4 mg total) under the tongue every 5 (five) minutes as needed for chest pain.  25 tablet  3   No facility-administered encounter medications on file as of 06/02/2012.    No Known Allergies  Past Medical History  Diagnosis Date  . 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 Shingles rash 2011    right scalp  . History of nephrolithiasis   . History of smallpox childhood  . Kidney stone ~ 2003  . CAD (coronary artery disease)     a. 12/11/2011 Ex MV: Med/Sev ant & antlat ischemia, EF 53%, 3mm ST dep V4-V6;  b. 12/11/2011 Cath/PTCA: LM  nl, LAD 20-47m, D1 99ost (Tx w/ PTCA only), LCX 40/7m, RCA diff nonobs dzs, EF 55%, sublte dist antlat HK.    Marland Kitchen Hyperlipidemia   . HTN (hypertension)     ROS: Negative except as per HPI  BP 128/78  Pulse 72  Ht 5\' 8"  (1.727 m)  Wt 80.74 kg (178 lb)  BMI 27.07 kg/m2  PHYSICAL EXAM: Pt is alert and oriented, NAD EXAM DEFERRED AS OUR TIME WAS SPENT IN DISCUSSION.  ASSESSMENT AND PLAN: 1. Coronary artery disease, native vessel. The patient is 6 months out from PCI with balloon angioplasty alone. He will stop Plavix and remain on aspirin indefinitely. We'll check a treadmill test as he continues to experience angina.  2. Hyperlipidemia. The patient is having myalgias. He's on high-dose atorvastatin. Will check a lipid panel and LFTs today and plan on changing him to low-dose Crestor.  Time spent in evaluation 20 minutes greater than 50% face-to-face.  Tonny Bollman 06/02/2012 9:24 AM

## 2012-06-02 NOTE — Patient Instructions (Addendum)
Your physician recommends that you have lab work today: LIPID and LIVER   Your physician has requested that you have an exercise tolerance test. For further information please visit [email or web address] Please also follow instruction sheet, as given.  Your physician has recommended you make the following change in your medication: STOP Plavix

## 2012-06-03 ENCOUNTER — Other Ambulatory Visit: Payer: Self-pay

## 2012-06-03 MED ORDER — ROSUVASTATIN CALCIUM 20 MG PO TABS: 20.0000 mg | ORAL_TABLET | Freq: Every day | ORAL | Status: DC

## 2012-06-06 ENCOUNTER — Other Ambulatory Visit (HOSPITAL_COMMUNITY): Payer: Self-pay | Admitting: Nurse Practitioner

## 2012-07-06 ENCOUNTER — Telehealth: Payer: Self-pay

## 2012-07-06 MED ORDER — METOPROLOL SUCCINATE ER 25 MG PO TB24: ORAL_TABLET | ORAL | Status: DC

## 2012-07-06 NOTE — Telephone Encounter (Signed)
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

## 2012-07-07 ENCOUNTER — Encounter: Payer: Self-pay | Admitting: Cardiovascular Disease

## 2012-07-07 ENCOUNTER — Ambulatory Visit (INDEPENDENT_AMBULATORY_CARE_PROVIDER_SITE_OTHER): Payer: PRIVATE HEALTH INSURANCE | Admitting: Cardiovascular Disease

## 2012-07-07 DIAGNOSIS — R079 Chest pain, unspecified: Secondary | ICD-10-CM

## 2012-07-07 DIAGNOSIS — E7800 Pure hypercholesterolemia, unspecified: Secondary | ICD-10-CM

## 2012-07-07 DIAGNOSIS — I2510 Atherosclerotic heart disease of native coronary artery without angina pectoris: Secondary | ICD-10-CM

## 2012-07-07 MED ORDER — METOPROLOL SUCCINATE ER 50 MG PO TB24: ORAL_TABLET | ORAL | Status: DC

## 2012-07-07 NOTE — Patient Instructions (Addendum)
Your physician has recommended you make the following change in your medication: INCREASE Metoprolol Succinate to 50mg  take one by mouth daily  Your physician wants you to follow-up in: 6 MONTHS with Dr Excell Seltzer.  You will receive a reminder letter in the mail two months in advance. If you don't receive a letter, please call our office to schedule the follow-up appointment.

## 2012-07-07 NOTE — Progress Notes (Signed)
Exercise Treadmill Test  Pre-Exercise Testing Evaluation Rhythm: normal sinus  Rate: 68     Test  Exercise Tolerance Test Ordering MD: Tonny Bollman, MD  Interpreting MD: Tonny Bollman, MD  Unique Test No: 1  Treadmill:  1  Indication for ETT: chest pain - rule out ischemia  Contraindication to ETT: No   Stress Modality: exercise - treadmill  Cardiac Imaging Performed: non   Protocol: standard Bruce - maximal  Max BP:  189/65  Max MPHR (bpm):  154 131  MPHR obtained (bpm):  133 % MPHR obtained:  86  Reached 85% MPHR (min:sec):  9:20 Total Exercise Time (min-sec):  11:00  Workload in METS:  13.4 Borg Scale: 16  Reason ETT Terminated:  desired heart rate attained    ST Segment Analysis At Rest: normal ST segments - no evidence of significant ST depression With Exercise: significant ischemic ST depression  Other Information Arrhythmia:  No Angina during ETT:  absent (0) Quality of ETT:  diagnostic  ETT Interpretation:  abnormal - evidence of ST depression consistent with ischemia  Comments: 1. Excellent exercise tolerance without anginal symptoms 2. Abnormal stress EKG with significant flat/upsloping diffuse ST depression greater than 2 mm 3. No arrhythmia with exertion  Recommendations: Increase Toprol XL to 50 mg daily. Follow-up in 6 months.

## 2012-07-27 ENCOUNTER — Telehealth: Payer: Self-pay | Admitting: Internal Medicine

## 2012-07-27 NOTE — Telephone Encounter (Signed)
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 Arm pain.  He has discussed this in the past.

## 2012-07-28 NOTE — Telephone Encounter (Signed)
He is scheduled for 9:00 Thurs due to his work schedule.  Asked his secretary to have him come a few min. Early to get registered.

## 2012-07-28 NOTE — Telephone Encounter (Signed)
I regret this wasn't brought to my attention in person so that I could have responded more timely. He may come by at any time - will work him in whenever he can come over.

## 2012-07-28 NOTE — Telephone Encounter (Signed)
Pt is coming is coming at 8:30 Thursday.

## 2012-07-29 ENCOUNTER — Ambulatory Visit (INDEPENDENT_AMBULATORY_CARE_PROVIDER_SITE_OTHER): Payer: PRIVATE HEALTH INSURANCE | Admitting: Internal Medicine

## 2012-07-29 ENCOUNTER — Encounter: Payer: Self-pay | Admitting: Internal Medicine

## 2012-07-29 VITALS — BP 130/80 | HR 69 | Temp 97.0°F | Ht 68.0 in | Wt 183.0 lb

## 2012-07-29 DIAGNOSIS — M5010 Cervical disc disorder with radiculopathy, unspecified cervical region: Secondary | ICD-10-CM

## 2012-07-29 DIAGNOSIS — M5412 Radiculopathy, cervical region: Secondary | ICD-10-CM

## 2012-07-29 NOTE — Patient Instructions (Addendum)
Thanks for coming. I agree with the good doctor- C8 left radiculopathy. MRI for tomorrow night at [address].  Consider signing up for MyChart

## 2012-07-29 NOTE — Progress Notes (Signed)
Subjective:    Patient ID: John Stein, male    DOB: December 10, 1945, 67 y.o.   MRN: [id number]  HPI Returns for a persistent problem with C8 radiculopathy left UE. He has pain in the UE and weakness and loss of feeling in the 5th digit left hand. He gets no relief with NSAIDs or gabapentin. He has pain at rest and has limitation in use during surgery. He has had no injury, no prior surgery.  From a cardiac standpoint he is doing great. Recent lab in April with excellent lipid control. GXT April '14 with minimal ST changes leading to an increase in metoprolol. He denies and chest pain or discomfort. Denies any limitation in activity.   Past Medical History  Diagnosis Date  . 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 Shingles rash 2011    right scalp  . History of nephrolithiasis   . History of smallpox childhood  . Kidney stone ~ 2003  . CAD (coronary artery disease)     a. 12/11/2011 Ex MV: Med/Sev ant & antlat ischemia, EF 53%, 3mm ST dep V4-V6;  b. 12/11/2011 Cath/PTCA: LM nl, LAD 20-37m, D1 99ost (Tx w/ PTCA only), LCX 40/19m, RCA diff nonobs dzs, EF 55%, sublte dist antlat HK.    Marland Kitchen Hyperlipidemia   . HTN (hypertension)    Past Surgical History  Procedure Laterality Date  . Tonsillectomy and adenoidectomy  1972  . Vasectomy  2009  . Coronary angioplasty  12/11/2011   Family History  Problem Relation Age of Onset  . Diabetes Neg Hx   . Hypertension Neg Hx   . Cancer Neg Hx    History   Social History  . Marital Status: Married    Spouse Name: N/A    Number of Children: 4  . Years of Education: 24   Occupational History  . neurosurgeon     [place]   Social History Main Topics  . Smoking status: Former Smoker -- 0.50 packs/day for 8 years    Types: Cigarettes  . Smokeless tobacco: Never Used     Comment: 12/11/2011 "stopped smoking cigarettes ~ 25 yr ago"  . Alcohol Use: 9.0 oz/week    15 Glasses of wine per week  . Drug Use: No  . Sexually Active: Yes -- Male  partner(s)   Other Topics Concern  . 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 [place] '84. Married 10 years - divorced. Married '90 - ; 2 sons - ;'11, '02; 2 dtrs - '91, '96. Work - Field seismologist. Marriage in good health. I son - ice skater/competitive, 1 son Chef in the [place] area.    Current Outpatient Prescriptions on File Prior to Visit  Medication Sig Dispense Refill  . aspirin 81 MG chewable tablet Chew 1 tablet (81 mg total) by mouth daily.      . metoprolol succinate (TOPROL-XL) 50 MG 24 hr tablet TAKE 1 TABLET BY MOUTH DAILY  90 tablet  3  . nitroGLYCERIN (NITROSTAT) 0.4 MG SL tablet Place 1 tablet (0.4 mg total) under the tongue every 5 (five) minutes as needed for chest pain.  25 tablet  3  . rosuvastatin (CRESTOR) 20 MG tablet Take 1 tablet (20 mg total) by mouth at bedtime.  90 tablet  3   No current facility-administered medications on file prior to visit.      Review of Systems System review is negative  for any constitutional, cardiac, pulmonary, GI or neuro symptoms or complaints other than as described in the HPI.     Objective:   Physical Exam Filed Vitals:   07/29/12 0823  BP: 130/80  Pulse: 69  Temp: 97 F (36.1 C)   Wt Readings from Last 3 Encounters:  07/29/12 183 lb (83.008 kg)  06/02/12 178 lb (80.74 kg)  12/24/11 179 lb (81.194 kg)   Gen'l WNWD white man in no distressed - well tanned. HEENT - C&S clear, PERRLA Cor - RRR Pulm - normal Neuro - DTRs left UE normal at biceps and radial tendons, normal ROM shoulder, elbow, hand. Weakness, "O" sign with thumb and 5th digit. Decreased sensation 4-th-5th digit left.       Assessment & Plan:  Neuro - C8 left radiculopathy.  Plan MRI cervical spine w/o contrast for May 30th, 2100 Hrs, @ GI [address], patient aware.  Recommendations to follow.   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  Plan  medrol Dosepak called in.

## 2012-07-30 ENCOUNTER — Other Ambulatory Visit: Payer: Self-pay | Admitting: Internal Medicine

## 2012-07-30 ENCOUNTER — Inpatient Hospital Stay: Admission: RE | Admit: 2012-07-30 | Payer: PRIVATE HEALTH INSURANCE | Source: Ambulatory Visit

## 2012-07-30 MED ORDER — METHYLPREDNISOLONE (PAK) 4 MG PO TABS: ORAL_TABLET | ORAL | Status: DC

## 2012-08-11 ENCOUNTER — Encounter: Payer: Self-pay | Admitting: Internal Medicine

## 2013-01-25 ENCOUNTER — Encounter: Payer: Self-pay | Admitting: Cardiovascular Disease

## 2013-01-25 NOTE — Telephone Encounter (Signed)
This encounter was created in error - please disregard.

## 2013-01-25 NOTE — Telephone Encounter (Signed)
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   His secretary want to know if you can work him in Tuesday, [place] 2nd?

## 2013-01-26 ENCOUNTER — Ambulatory Visit: Payer: PRIVATE HEALTH INSURANCE | Admitting: Cardiovascular Disease

## 2013-02-02 ENCOUNTER — Encounter: Payer: Self-pay | Admitting: Cardiovascular Disease

## 2013-02-02 ENCOUNTER — Ambulatory Visit (INDEPENDENT_AMBULATORY_CARE_PROVIDER_SITE_OTHER): Payer: PRIVATE HEALTH INSURANCE | Admitting: Cardiovascular Disease

## 2013-02-02 ENCOUNTER — Encounter (INDEPENDENT_AMBULATORY_CARE_PROVIDER_SITE_OTHER): Payer: Self-pay

## 2013-02-02 VITALS — BP 114/70 | HR 58 | Ht 68.0 in | Wt 186.2 lb

## 2013-02-02 DIAGNOSIS — E785 Hyperlipidemia, unspecified: Secondary | ICD-10-CM

## 2013-02-02 DIAGNOSIS — I10 Essential (primary) hypertension: Secondary | ICD-10-CM

## 2013-02-02 DIAGNOSIS — I2510 Atherosclerotic heart disease of native coronary artery without angina pectoris: Secondary | ICD-10-CM

## 2013-02-02 MED ORDER — ISOSORBIDE MONONITRATE ER 30 MG PO TB24: 30.0000 mg | ORAL_TABLET | Freq: Every day | ORAL | Status: DC

## 2013-02-02 NOTE — Progress Notes (Signed)
    HPI:  67 year old gentleman presenting for followup of coronary artery disease. Dr. Jeral Fruit and a high risk stress test last year and was treated with balloon angioplasty of severe stenosis of the first diagonal branch. Labs from April 2014 showed normal liver function tests with a cholesterol of 134, triglycerides 110, HDL 43, and LDL 69.  He has been having episodic anginal symptoms during the stress of performing surgery. He describes a pressure sensation in the center of his chest. He has not had any exertional angina and he exercises regularly. He denies shortness of breath, palpitations, or lightheadedness. He's been compliant with his medical therapy. He does take nitroglycerin on occasion.  Outpatient Encounter Prescriptions as of 02/02/2013  Medication Sig  . aspirin 81 MG chewable tablet Chew 1 tablet (81 mg total) by mouth daily.  . metoprolol succinate (TOPROL-XL) 50 MG 24 hr tablet TAKE 1 TABLET BY MOUTH DAILY  . nitroGLYCERIN (NITROSTAT) 0.4 MG SL tablet Place 1 tablet (0.4 mg total) under the tongue every 5 (five) minutes as needed for chest pain.  . rosuvastatin (CRESTOR) 20 MG tablet Take 1 tablet (20 mg total) by mouth at bedtime.  . [DISCONTINUED] methylPREDNIsolone (MEDROL DOSPACK) 4 MG tablet follow package directions    No Known Allergies  Past Medical History  Diagnosis Date  . 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 Shingles rash 2011    right scalp  . History of nephrolithiasis   . History of smallpox childhood  . Kidney stone ~ 2003  . CAD (coronary artery disease)     a. 12/11/2011 Ex MV: Med/Sev ant & antlat ischemia, EF 53%, 3mm ST dep V4-V6;  b. 12/11/2011 Cath/PTCA: LM nl, LAD 20-9m, D1 99ost (Tx w/ PTCA only), LCX 40/98m, RCA diff nonobs dzs, EF 55%, sublte dist antlat HK.    Marland Kitchen Hyperlipidemia   . HTN (hypertension)     ROS: Negative except as per HPI  BP 114/70  Pulse 58  Ht 5\' 8"  (1.727 m)  Wt 186 lb 3.2 oz (84.46 kg)  BMI 28.32  kg/m2  PHYSICAL EXAM: Pt is alert and oriented, NAD HEENT: normal Neck: JVP - normal, carotids 2+= without bruits Lungs: CTA bilaterally CV: RRR without murmur or gallop Abd: soft, NT, Positive BS, no hepatomegaly Ext: no C/C/E, distal pulses intact and equal Skin: warm/dry no rash  EKG:  Sinus rhythm 58 beats per minute, borderline changes for LVH, otherwise within normal limits.  ASSESSMENT AND PLAN: 1. Coronary artery disease, native vessel. The patient has angina associated with certain stressors. I have recommended that he start isosorbide 15 mg daily to be up titrated to 30 mg if needed. He will continue on his beta blocker at current dose. I will see him back in 6 months for treadmill study. He should continue aspirin 81 mg.  2. Hyperlipidemia. He is maintained on Crestor 20 mg and is tolerating this well. Will check lipids and LFTs in 6 months. Previous lipids were reviewed and have been at goal with a total cholesterol of 134, HDL 43, and LDL 69.  Tonny Bollman 02/02/2013 5:44 PM

## 2013-02-02 NOTE — Patient Instructions (Signed)
Your physician has recommended you make the following change in your medication: START Isosorbide MN 30mg  take one-half tablet by mouth daily, you can increase dosage to 30mg  daily if needed   Your physician recommends that you return for a FASTING LIPID and LIVER profile in 6 MONTHS--nothing to eat or drink after midnight, lab opens at 7:30 AM.   Your physician has requested that you have an exercise tolerance test in 6 MONTHS with Dr Excell Seltzer. For further information please visit [email or web address] You will receive a reminder letter in the mail two months in advance. If you don't receive a letter, please call our office to schedule the follow-up appointment.

## 2013-03-16 ENCOUNTER — Other Ambulatory Visit: Payer: Self-pay

## 2013-03-16 DIAGNOSIS — R079 Chest pain, unspecified: Secondary | ICD-10-CM

## 2013-03-16 DIAGNOSIS — E7800 Pure hypercholesterolemia, unspecified: Secondary | ICD-10-CM

## 2013-03-16 DIAGNOSIS — I2510 Atherosclerotic heart disease of native coronary artery without angina pectoris: Secondary | ICD-10-CM

## 2013-03-16 MED ORDER — METOPROLOL SUCCINATE ER 50 MG PO TB24: ORAL_TABLET | ORAL | Status: DC

## 2013-03-16 MED ORDER — ROSUVASTATIN CALCIUM 20 MG PO TABS: 20.0000 mg | ORAL_TABLET | Freq: Every day | ORAL | Status: DC

## 2013-03-16 MED ORDER — ISOSORBIDE MONONITRATE ER 30 MG PO TB24
30.0000 mg | ORAL_TABLET | Freq: Every day | ORAL | Status: DC
Start: 2013-03-16 — End: 2013-12-08

## 2013-05-04 ENCOUNTER — Other Ambulatory Visit: Payer: Self-pay | Admitting: Nurse Practitioner

## 2013-05-04 MED ORDER — NITROGLYCERIN 0.4 MG SL SUBL: 0.4000 mg | SUBLINGUAL_TABLET | SUBLINGUAL | Status: DC | PRN

## 2013-06-08 ENCOUNTER — Ambulatory Visit (INDEPENDENT_AMBULATORY_CARE_PROVIDER_SITE_OTHER): Payer: PRIVATE HEALTH INSURANCE | Admitting: Internal Medicine

## 2013-06-08 ENCOUNTER — Encounter: Payer: Self-pay | Admitting: Internal Medicine

## 2013-06-08 ENCOUNTER — Other Ambulatory Visit (INDEPENDENT_AMBULATORY_CARE_PROVIDER_SITE_OTHER): Payer: PRIVATE HEALTH INSURANCE

## 2013-06-08 VITALS — BP 132/70 | HR 87 | Temp 97.5°F | Resp 16 | Ht 68.0 in | Wt 187.2 lb

## 2013-06-08 DIAGNOSIS — G4730 Sleep apnea, unspecified: Secondary | ICD-10-CM

## 2013-06-08 DIAGNOSIS — R10812 Left upper quadrant abdominal tenderness: Secondary | ICD-10-CM

## 2013-06-08 DIAGNOSIS — E785 Hyperlipidemia, unspecified: Secondary | ICD-10-CM

## 2013-06-08 DIAGNOSIS — K219 Gastro-esophageal reflux disease without esophagitis: Secondary | ICD-10-CM

## 2013-06-08 DIAGNOSIS — G4733 Obstructive sleep apnea (adult) (pediatric): Secondary | ICD-10-CM | POA: Insufficient documentation

## 2013-06-08 LAB — LIPID PANEL
CHOL/HDL RATIO: 3
Cholesterol: 187 mg/dL (ref 0–200)
HDL: 60.5 mg/dL (ref >39.00)
LDL Cholesterol: 93 mg/dL (ref 0–99)
TRIGLYCERIDES: 166 mg/dL — AB (ref 0.0–149.0)
VLDL: 33.2 mg/dL (ref 0.0–40.0)

## 2013-06-08 LAB — CBC WITH DIFFERENTIAL/PLATELET
BASOS PCT: 0.5 % (ref 0.0–3.0)
Basophils Absolute: 0 10*3/uL (ref 0.0–0.1)
EOS ABS: 0.1 10*3/uL (ref 0.0–0.7)
EOS PCT: 1 % (ref 0.0–5.0)
HEMATOCRIT: 42.1 % (ref 39.0–52.0)
HEMOGLOBIN: 14.1 g/dL (ref 13.0–17.0)
Lymphocytes Relative: 20.8 % (ref 12.0–46.0)
Lymphs Abs: 1.3 10*3/uL (ref 0.7–4.0)
MCHC: 33.6 g/dL (ref 30.0–36.0)
MCV: 90.7 fl (ref 78.0–100.0)
MONO ABS: 0.4 10*3/uL (ref 0.1–1.0)
Monocytes Relative: 7.2 % (ref 3.0–12.0)
NEUTROS ABS: 4.4 10*3/uL (ref 1.4–7.7)
Neutrophils Relative %: 70.5 % (ref 43.0–77.0)
Platelets: 200 10*3/uL (ref 150.0–400.0)
RBC: 4.64 Mil/uL (ref 4.22–5.81)
RDW: 13.2 % (ref 11.5–14.6)
WBC: 6.3 10*3/uL (ref 4.5–10.5)

## 2013-06-08 LAB — URINALYSIS, ROUTINE W REFLEX MICROSCOPIC
Bilirubin Urine: NEGATIVE
Hgb urine dipstick: NEGATIVE
Ketones, ur: NEGATIVE
Leukocytes, UA: NEGATIVE
NITRITE: NEGATIVE
SPECIFIC GRAVITY, URINE: 1.02 (ref 1.000–1.030)
TOTAL PROTEIN, URINE-UPE24: NEGATIVE
URINE GLUCOSE: NEGATIVE
UROBILINOGEN UA: 0.2 (ref 0.0–1.0)
pH: 7 (ref 5.0–8.0)

## 2013-06-08 LAB — COMPREHENSIVE METABOLIC PANEL
ALK PHOS: 63 U/L (ref 39–117)
ALT: 29 U/L (ref 0–53)
AST: 25 U/L (ref 0–37)
Albumin: 3.9 g/dL (ref 3.5–5.2)
BILIRUBIN TOTAL: 0.6 mg/dL (ref 0.3–1.2)
BUN: 21 mg/dL (ref 6–23)
CO2: 29 mEq/L (ref 19–32)
CREATININE: 0.8 mg/dL (ref 0.4–1.5)
Calcium: 9.1 mg/dL (ref 8.4–10.5)
Chloride: 104 mEq/L (ref 96–112)
GFR: 96.89 mL/min (ref >60.00)
Glucose, Bld: 108 mg/dL — ABNORMAL HIGH (ref 70–99)
POTASSIUM: 4.3 meq/L (ref 3.5–5.1)
Sodium: 140 mEq/L (ref 135–145)
Total Protein: 6.9 g/dL (ref 6.0–8.3)

## 2013-06-08 LAB — FECAL OCCULT BLOOD, GUAIAC: Fecal Occult Blood: NEGATIVE

## 2013-06-08 LAB — AMYLASE: Amylase: 66 U/L (ref 27–131)

## 2013-06-08 LAB — LIPASE: Lipase: 29 U/L (ref 11.0–59.0)

## 2013-06-08 LAB — TSH: TSH: 2.5 u[IU]/mL (ref 0.35–5.50)

## 2013-06-08 LAB — SEDIMENTATION RATE: Sed Rate: 8 mm/hr (ref 0–22)

## 2013-06-08 MED ORDER — DEXLANSOPRAZOLE 60 MG PO CPDR: 60.0000 mg | DELAYED_RELEASE_CAPSULE | Freq: Every day | ORAL | Status: DC

## 2013-06-08 NOTE — Progress Notes (Signed)
Subjective:    Patient ID: John Stein, male    DOB: [birth year], 68 y.o.   MRN: [id number]  Gastrophageal Reflux He complains of abdominal pain (crampy,intermittent LUQ pain for 3 months), belching and heartburn. He reports no chest pain, no choking, no coughing, no dysphagia, no early satiety, no globus sensation, no hoarse voice, no nausea, no sore throat, no stridor, no tooth decay, no water brash or no wheezing. This is a chronic problem. Episode onset: 10 plus years. The problem has been gradually worsening. The heartburn duration is several minutes. The heartburn is located in the substernum and LUQ. The heartburn is of moderate intensity. The heartburn does not wake him from sleep. The heartburn does not limit his activity. The heartburn doesn't change with position. The symptoms are aggravated by ETOH. Pertinent negatives include no anemia, fatigue, melena, muscle weakness, orthopnea or weight loss. Risk factors include NSAIDs and ETOH use. He has tried nothing for the symptoms. The treatment provided no relief.      Review of Systems  Constitutional: Negative.  Negative for fever, chills, weight loss, diaphoresis, appetite change and fatigue.  HENT: Negative.  Negative for hoarse voice and sore throat.   Eyes: Negative.   Respiratory: Positive for apnea (and heavy snoring). Negative for cough, choking, chest tightness, shortness of breath, wheezing and stridor.   Cardiovascular: Negative.  Negative for chest pain, palpitations and leg swelling.  Gastrointestinal: Positive for heartburn and abdominal pain (crampy,intermittent LUQ pain for 3 months). Negative for dysphagia, nausea, vomiting, diarrhea, constipation, blood in stool, melena, abdominal distention, anal bleeding and rectal pain.  Endocrine: Negative.   Genitourinary: Negative.  Negative for frequency, hematuria, flank pain, difficulty urinating and testicular pain.  Musculoskeletal: Negative.  Negative for muscle weakness.    Skin: Negative.  Negative for color change and rash.  Allergic/Immunologic: Negative.   Neurological: Negative.   Hematological: Negative.  Negative for adenopathy. Does not bruise/bleed easily.  Psychiatric/Behavioral: Negative.        Objective:   Physical Exam  Vitals reviewed. Constitutional: He is oriented to person, place, and time. He appears well-developed and well-nourished.  Non-toxic appearance. He does not have a sickly appearance. He does not appear ill. No distress.  HENT:  Head: Normocephalic and atraumatic.  Mouth/Throat: Oropharynx is clear and moist. No oropharyngeal exudate.  Eyes: Conjunctivae are normal. Right eye exhibits no discharge. Left eye exhibits no discharge. No scleral icterus.  Neck: Normal range of motion. Neck supple. No JVD present. No tracheal deviation present. No thyromegaly present.  Cardiovascular: Normal rate, regular rhythm, normal heart sounds and intact distal pulses.  Exam reveals no gallop and no friction rub.   No murmur heard. Pulmonary/Chest: Effort normal and breath sounds normal. No stridor. No respiratory distress. He has no wheezes. He has no rales. He exhibits no tenderness.  Abdominal: Soft. Normal appearance and bowel sounds are normal. He exhibits no distension, no pulsatile liver, no fluid wave, no abdominal bruit, no ascites, no pulsatile midline mass and no mass. There is no hepatosplenomegaly, splenomegaly or hepatomegaly. There is tenderness in the left upper quadrant. There is no rigidity, no rebound, no guarding, no CVA tenderness, no tenderness at McBurney's point and negative Murphy's sign. No hernia. Hernia confirmed negative in the ventral area, confirmed negative in the right inguinal area and confirmed negative in the left inguinal area.  Genitourinary: Rectum normal, testes normal and penis normal. Rectal exam shows no external hemorrhoid, no internal hemorrhoid, no fissure, no mass, no tenderness  and anal tone normal.  Guaiac negative stool. Prostate is enlarged (1+ smooth symm BPH). Prostate is not tender. Right testis shows no mass, no swelling and no tenderness. Right testis is descended. Left testis shows no mass, no swelling and no tenderness. Left testis is descended. Circumcised. No penile erythema or penile tenderness. No discharge found.  Musculoskeletal: Normal range of motion. He exhibits no edema and no tenderness.  Lymphadenopathy:    He has no cervical adenopathy.       Right: No inguinal adenopathy present.       Left: No inguinal adenopathy present.  Neurological: He is oriented to person, place, and time.  Skin: Skin is warm and dry. No rash noted. He is not diaphoretic. No erythema. No pallor.  Psychiatric: He has a normal mood and affect. His behavior is normal. Judgment and thought content normal.     Lab Results  Component Value Date   WBC 5.3 12/12/2011   HGB 14.0 12/12/2011   HCT 39.1 12/12/2011   PLT 145* 12/12/2011   GLUCOSE 98 12/12/2011   CHOL 134 06/02/2012   TRIG 110.0 06/02/2012   HDL 43.40 06/02/2012   LDLDIRECT 156.5 12/01/2011   LDLCALC 69 06/02/2012   ALT 28 06/02/2012   AST 24 06/02/2012   NA 140 12/12/2011   K 3.6 12/12/2011   CL 105 12/12/2011   CREATININE 0.81 12/12/2011   BUN 13 12/12/2011   CO2 27 12/12/2011   PSA 2.27 12/01/2011   INR 0.94 12/11/2011       Assessment & Plan:

## 2013-06-08 NOTE — Assessment & Plan Note (Signed)
I think he needs to start treating the GERD and have asked him to start a PPI

## 2013-06-08 NOTE — Patient Instructions (Signed)
Abdominal Pain, Adult °Many things can cause abdominal pain. Usually, abdominal pain is not caused by a disease and will improve without treatment. It can often be observed and treated at home. Your health care provider will do a physical exam and possibly order blood tests and X-rays to help determine the seriousness of your pain. However, in many cases, more time must pass before a clear cause of the pain can be found. Before that point, your health care provider may not know if you need more testing or further treatment. °HOME CARE INSTRUCTIONS  °Monitor your abdominal pain for any changes. The following actions may help to alleviate any discomfort you are experiencing: °· Only take over-the-counter or prescription medicines as directed by your health care provider. °· Do not take laxatives unless directed to do so by your health care provider. °· Try a clear liquid diet (broth, tea, or water) as directed by your health care provider. Slowly move to a bland diet as tolerated. °SEEK MEDICAL CARE IF: °· You have unexplained abdominal pain. °· You have abdominal pain associated with nausea or diarrhea. °· You have pain when you urinate or have a bowel movement. °· You experience abdominal pain that wakes you in the night. °· You have abdominal pain that is worsened or improved by eating food. °· You have abdominal pain that is worsened with eating fatty foods. °SEEK IMMEDIATE MEDICAL CARE IF:  °· Your pain does not go away within 2 hours. °· You have a fever. °· You keep throwing up (vomiting). °· Your pain is felt only in portions of the abdomen, such as the right side or the left lower portion of the abdomen. °· You pass bloody or black tarry stools. °MAKE SURE YOU: °· Understand these instructions.   °· Will watch your condition.   °· Will get help right away if you are not doing well or get worse.   °Document Released: 11/27/2004 Document Revised: 12/08/2012 Document Reviewed: 10/27/2012 °Exit[employer]® Patient  Information ©2014 [employer]. ° °

## 2013-06-08 NOTE — Assessment & Plan Note (Signed)
I have asked him to see sleep med for further evaluation

## 2013-06-08 NOTE — Assessment & Plan Note (Signed)
He has reached his LDL goal and is doing well on crestor

## 2013-06-08 NOTE — Assessment & Plan Note (Signed)
The exam is benign I think this is related to the GERD or maybe a small ulcer I have asked him to stop taking nsaids and to start a PPI His labs are all normal today - ie. No organic pathology Will try the PPI for a few weeks and is his s/s do not improve then will ask him to see GI to consider upper endoscopy

## 2013-06-08 NOTE — Progress Notes (Signed)
Pre visit review using our clinic review tool, if applicable. No additional management support is needed unless otherwise documented below in the visit note. 

## 2013-07-06 ENCOUNTER — Ambulatory Visit (INDEPENDENT_AMBULATORY_CARE_PROVIDER_SITE_OTHER): Payer: PRIVATE HEALTH INSURANCE | Admitting: Pulmonary Disease

## 2013-07-06 ENCOUNTER — Encounter: Payer: Self-pay | Admitting: Pulmonary Disease

## 2013-07-06 VITALS — BP 120/80 | HR 57 | Temp 98.0°F | Ht 69.0 in | Wt 185.6 lb

## 2013-07-06 DIAGNOSIS — R0609 Other forms of dyspnea: Secondary | ICD-10-CM

## 2013-07-06 DIAGNOSIS — R0989 Other specified symptoms and signs involving the circulatory and respiratory systems: Secondary | ICD-10-CM

## 2013-07-06 DIAGNOSIS — R0683 Snoring: Secondary | ICD-10-CM

## 2013-07-06 NOTE — Patient Instructions (Signed)
Will schedule for home sleep testing, and will call once results are available.  

## 2013-07-06 NOTE — Progress Notes (Signed)
   Subjective:    Patient ID: John Stein, male    DOB: 02/21/1946, 68 y.o.   MRN: [id number]  HPI The patient is a 68 year old male who I've been asked to see for possible obstructive sleep apnea. He is been noted to have loud snoring by his wife, as well as witnessed apneas on occasion. This typically occurs after having 2 glasses of wine in the evening. He notes frequent awakenings during the night to go to the bathroom, and is unrested about 50% of the mornings upon awakening. He is extremely busy during the day, and really does not have time for inappropriate sleepiness. He has no issues with sleepiness in the evenings with reading, and denies sleepiness with driving. His weight is neutral over the last few years, and his Epworth score is 0.   Sleep Questionnaire What time do you typically go to bed?( Between what hours) 11pm 11pm at 0941 on 07/06/13 by Lilli Few, CMA How long does it take you to fall asleep? 1 hour 1 hour at 0941 on 07/06/13 by Lilli Few, CMA How many times during the night do you wake up? 4 4 at 0941 on 07/06/13 by Lilli Few, CMA What time do you get out of bed to start your day? 0700 0700 at 0941 on 07/06/13 by Lilli Few, CMA Do you drive or operate heavy machinery in your occupation? No No at 0941 on 07/06/13 by Lilli Few, CMA How much has your weight changed (up or down) over the past two years? (In pounds) 0 oz (0 kg) 0 oz (0 kg) at 0941 on 07/06/13 by Lilli Few, CMA Have you ever had a sleep study before? No No at 0941 on 07/06/13 by Lilli Few, CMA Do you currently use CPAP? No No at 0941 on 07/06/13 by Lilli Few, CMA Do you wear oxygen at any time? No   Review of Systems  Constitutional: Negative for fever and unexpected weight change.  HENT: Positive for sneezing. Negative for congestion, dental problem, ear pain, nosebleeds, postnasal drip, rhinorrhea, sinus pressure,  sore throat and trouble swallowing.   Eyes: Negative for redness and itching.  Respiratory: Negative for cough, chest tightness, shortness of breath and wheezing.   Cardiovascular: Negative for palpitations and leg swelling.  Gastrointestinal: Negative for nausea and vomiting.  Genitourinary: Negative for dysuria.  Musculoskeletal: Negative for joint swelling.  Skin: Negative for rash.  Neurological: Negative for headaches.  Hematological: Does not bruise/bleed easily.  Psychiatric/Behavioral: Negative for dysphoric mood. The patient is not nervous/anxious.        Objective:   Physical Exam Constitutional:  Well developed, no acute distress  HENT:  Nares patent without discharge  Oropharynx without exudate, palate and uvula are minimally elongated.   Eyes:  Perrla, eomi, no scleral icterus  Neck:  No JVD, no TMG  Cardiovascular:  Normal rate, regular rhythm, no rubs or gallops.  No murmurs        Intact distal pulses  Pulmonary :  Normal breath sounds, no stridor or respiratory distress   No rales, rhonchi, or wheezing  Abdominal:  Soft, nondistended, bowel sounds present.  No tenderness noted.   Musculoskeletal:  No lower extremity edema noted.  Lymph Nodes:  No cervical lymphadenopathy noted  Skin:  No cyanosis noted  Neurologic:  Alert, appropriate, moves all 4 extremities without obvious deficit.         Assessment & Plan:

## 2013-07-06 NOTE — Assessment & Plan Note (Signed)
The patient has been noted to have loud snoring as well as witnessed apneas at times by his bed partner, and he is having frequent awakenings at night with nonrestorative sleep 50% of the time. However, he does not have significant impact to his quality of life during the day. It is unclear to me whether he has sleep apnea, or whether this may just be symptomatic snoring or the upper airway resistant syndrome. At this point, he will need a sleep study for evaluation, and he is an excellent candidate for home sleep testing. The patient is agreeable to this approach.

## 2013-08-01 ENCOUNTER — Other Ambulatory Visit: Payer: PRIVATE HEALTH INSURANCE

## 2013-08-01 ENCOUNTER — Encounter: Payer: Self-pay | Admitting: Cardiovascular Disease

## 2013-08-01 NOTE — Telephone Encounter (Signed)
This encounter was created in error - please disregard.

## 2013-08-01 NOTE — Telephone Encounter (Signed)
New problem   Want to know if he need to repeat labs. He had labs drawn from Dr Ronnald Ramp from [facility] 6wk ago, may have had these already done.

## 2013-08-03 ENCOUNTER — Ambulatory Visit (INDEPENDENT_AMBULATORY_CARE_PROVIDER_SITE_OTHER): Payer: PRIVATE HEALTH INSURANCE | Admitting: Cardiovascular Disease

## 2013-08-03 DIAGNOSIS — I10 Essential (primary) hypertension: Secondary | ICD-10-CM

## 2013-08-03 DIAGNOSIS — E785 Hyperlipidemia, unspecified: Secondary | ICD-10-CM

## 2013-08-03 DIAGNOSIS — I2510 Atherosclerotic heart disease of native coronary artery without angina pectoris: Secondary | ICD-10-CM

## 2013-08-03 NOTE — Progress Notes (Signed)
Exercise Treadmill Test  Pre-Exercise Testing Evaluation Rhythm: normal sinus  Rate: 86 bpm     Test  Exercise Tolerance Test Ordering MD: Sherren Mocha, MD  Interpreting MD: Sherren Mocha, MD  Unique Test No: 1  Treadmill:  1  Indication for ETT: known ASHD  Contraindication to ETT: No   Stress Modality: exercise - treadmill  Cardiac Imaging Performed: non   Protocol: standard Bruce - maximal  Max BP:  223/89  Max MPHR (bpm):  153 85% MPR (bpm):  130  MPHR obtained (bpm):  148 % MPHR obtained:  96  Reached 85% MPHR (min:sec):  9:17 Total Exercise Time (min-sec):  12:00  Workload in METS:  13.4 Borg Scale: 17  Reason ETT Terminated:  fatigue    ST Segment Analysis At Rest: normal ST segments - no evidence of significant ST depression With Exercise: significant ischemic ST depression  Other Information Arrhythmia:  No Angina during ETT:  absent (0) Quality of ETT:  diagnostic  ETT Interpretation:  abnormal - evidence of ST depression consistent with ischemia  Comments: 1. Significant ST depression in Stage 4 of the Bruce Protocol, but upsloping ST segments with rapid resolution in early recovery 2. No angina or arrhythmia with exertion 3. Hypertensive responsive to exercise (beta-blocker held for test) 4. Excellent exercise tolerance   Recommendations: Continue current medical program. No angina and improved exercise tolerance from last year.

## 2013-08-03 NOTE — Patient Instructions (Signed)
Your physician wants you to follow-up in: 1 YEAR with Dr Burt Knack.  You will receive a reminder letter in the mail two months in advance. If you don't receive a letter, please call our office to schedule the follow-up appointment.

## 2013-09-05 ENCOUNTER — Other Ambulatory Visit: Payer: Self-pay | Admitting: Pulmonary Disease

## 2013-09-05 DIAGNOSIS — G4733 Obstructive sleep apnea (adult) (pediatric): Secondary | ICD-10-CM

## 2013-09-06 ENCOUNTER — Encounter: Payer: Self-pay | Admitting: Pulmonary Disease

## 2013-09-22 ENCOUNTER — Telehealth: Payer: Self-pay | Admitting: Pulmonary Disease

## 2013-09-22 DIAGNOSIS — G4733 Obstructive sleep apnea (adult) (pediatric): Secondary | ICD-10-CM

## 2013-09-22 NOTE — Telephone Encounter (Signed)
Called spoke with pt. He reports he wants to go through sleep direct as stated below and not [facility]. Order placed and will make PCC's aware.

## 2013-09-22 NOTE — Telephone Encounter (Signed)
Pt has returned call & asks to be reached at [phone].  John Stein

## 2013-09-22 NOTE — Telephone Encounter (Signed)
Provider Comments 09/05/2013 6:33 PM CLANCE, KEITH M     Note    resmed s10 air/auto with h/h and climate control tubing. Set on auto 5-20cm, mask of choice. Enroll in [place].    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  ----  Order was faxed to [facility]. Called # above and it for [facility]. Spoke with [employer]. He reports pt placed an order with them yesterday to get resmed s10. They need this on RX pad with settings, etc. I advised him order was sent to [facility]. He advised me to call the patient then.    lmtcb x1 for pt to confirm this is where he wants his CPAP rx to go to.

## 2013-12-08 ENCOUNTER — Other Ambulatory Visit: Payer: Self-pay | Admitting: *Deleted

## 2013-12-08 MED ORDER — ISOSORBIDE MONONITRATE ER 30 MG PO TB24: 30.0000 mg | ORAL_TABLET | Freq: Every day | ORAL | Status: DC

## 2014-02-09 ENCOUNTER — Encounter (HOSPITAL_COMMUNITY): Payer: Self-pay | Admitting: Cardiovascular Disease

## 2014-02-22 ENCOUNTER — Other Ambulatory Visit: Payer: Self-pay

## 2014-02-22 DIAGNOSIS — R079 Chest pain, unspecified: Secondary | ICD-10-CM

## 2014-02-22 DIAGNOSIS — I2510 Atherosclerotic heart disease of native coronary artery without angina pectoris: Secondary | ICD-10-CM

## 2014-02-22 DIAGNOSIS — E7800 Pure hypercholesterolemia, unspecified: Secondary | ICD-10-CM

## 2014-02-22 MED ORDER — METOPROLOL SUCCINATE ER 50 MG PO TB24: ORAL_TABLET | ORAL | Status: DC

## 2014-02-22 MED ORDER — ROSUVASTATIN CALCIUM 20 MG PO TABS: 20.0000 mg | ORAL_TABLET | Freq: Every day | ORAL | Status: DC

## 2014-02-22 MED ORDER — ISOSORBIDE MONONITRATE ER 30 MG PO TB24: 30.0000 mg | ORAL_TABLET | Freq: Every day | ORAL | Status: DC

## 2014-03-22 ENCOUNTER — Other Ambulatory Visit (HOSPITAL_COMMUNITY): Payer: Self-pay | Admitting: Neurosurgery

## 2014-03-22 DIAGNOSIS — M5417 Radiculopathy, lumbosacral region: Secondary | ICD-10-CM

## 2014-07-03 ENCOUNTER — Encounter: Payer: Self-pay | Admitting: Internal Medicine

## 2014-08-16 ENCOUNTER — Other Ambulatory Visit: Payer: Self-pay

## 2014-08-16 MED ORDER — NITROGLYCERIN 0.4 MG SL SUBL: 0.4000 mg | SUBLINGUAL_TABLET | SUBLINGUAL | Status: DC | PRN

## 2014-08-16 NOTE — Progress Notes (Unsigned)
Patient walked in needing a refill for his Nitro stat  Since he was going to the [facility] Saturday

## 2014-08-21 ENCOUNTER — Other Ambulatory Visit: Payer: Self-pay | Admitting: *Deleted

## 2014-08-21 MED ORDER — ROSUVASTATIN CALCIUM 20 MG PO TABS: 20.0000 mg | ORAL_TABLET | Freq: Every day | ORAL | Status: DC

## 2014-09-12 ENCOUNTER — Encounter: Payer: PRIVATE HEALTH INSURANCE | Admitting: Internal Medicine

## 2014-09-12 LAB — HM COLONOSCOPY

## 2014-09-21 NOTE — Progress Notes (Signed)
Cardiology Office Note Date:  09/22/2014   ID:  John Cha, John Stein, DOB 03/18/1945, MRN [id number]  PCP:  Scarlette Calico, John Stein  Cardiologist:  Sherren Mocha, John Stein    Chief Complaint  Patient presents with  . Coronary Artery Disease   History of Present Illness: John Cha, John Stein is a 69 y.o. male who presents for follow-up of CAD. He had a high-risk stress test in 2013 and cardiac cath showed severe diagonal stenosis and he underwent PTCA at that time. He has had episodic angina associated with the stress of performing neurosurgery.   Over the past year he has done quite well. He denies any angina. He has picked up NTG because he's done some remote traveling, but hasn't required any NTG use. He's hiked throughout the [place] without problems. Denies CP or dyspnea. Compliant with medial therapy.   Past Medical History  Diagnosis Date  . 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 Shingles rash 2011    right scalp  . History of nephrolithiasis   . History of smallpox childhood  . Kidney stone ~ 2003  . CAD (coronary artery disease)     a. 12/11/2011 Ex MV: Med/Sev ant & antlat ischemia, EF 53%, 53mm ST dep V4-V6;  b. 12/11/2011 Cath/PTCA: LM nl, LAD 20-83m, D1 99ost (Tx w/ PTCA only), LCX 40/20m, RCA diff nonobs dzs, EF 55%, sublte dist antlat HK.    Marland Kitchen Hyperlipidemia   . HTN (hypertension)     Past Surgical History  Procedure Laterality Date  . Tonsillectomy and adenoidectomy  1972  . Vasectomy  2009  . Coronary angioplasty  12/11/2011  . Left heart catheterization with coronary angiogram N/A 12/11/2011    Procedure: LEFT HEART CATHETERIZATION WITH CORONARY ANGIOGRAM;  Surgeon: Sherren Mocha, John Stein;  Location: [facility] CATH LAB;  Service: Cardiovascular;  Laterality: N/A;    Current Outpatient Prescriptions  Medication Sig Dispense Refill  . aspirin 81 MG chewable tablet Chew 1 tablet (81 mg total) by mouth daily.    . isosorbide mononitrate (IMDUR) 30 MG 24 hr tablet Take 1  tablet (30 mg total) by mouth daily. 90 tablet 3  . metoprolol succinate (TOPROL-XL) 50 MG 24 hr tablet TAKE 1 TABLET BY MOUTH DAILY 90 tablet 3  . nitroGLYCERIN (NITROSTAT) 0.4 MG SL tablet Place 1 tablet (0.4 mg total) under the tongue every 5 (five) minutes as needed for chest pain. 25 tablet 3  . rosuvastatin (CRESTOR) 20 MG tablet Take 1 tablet (20 mg total) by mouth at bedtime. 30 tablet 0   No current facility-administered medications for this visit.    Allergies:   Review of patient's allergies indicates no known allergies.   Social History:  The patient  reports that he quit smoking about 27 years ago. His smoking use included Cigarettes. He has a 4 pack-year smoking history. He has never used smokeless tobacco. He reports that he drinks about 9.0 oz of alcohol per week. He reports that he does not use illicit drugs.   Family History:  The patient's family history includes AAA (abdominal aortic aneurysm) in his father. There is no history of Diabetes, Hypertension, or Cancer.    ROS:  Please see the history of present illness.  All other systems are reviewed and negative.    PHYSICAL EXAM: VS:  BP 130/76 mmHg  Pulse 85  Ht 5\' 9"  (1.753 m)  Wt 187 lb 12.8 oz (85.186 kg)  BMI 27.72 kg/m2 , BMI Body mass index is  27.72 kg/(m^2). GEN: Well nourished, well developed, in no acute distress HEENT: normal Neck: no JVD, no masses. No carotid bruits Cardiac: RRR without murmur or gallop                Respiratory:  clear to auscultation bilaterally, normal work of breathing GI: soft, nontender, nondistended, + BS MS: no deformity or atrophy Ext: no pretibial edema, pedal pulses 2+= bilaterally Skin: warm and dry, no rash Neuro:  Strength and sensation are intact Psych: euthymic mood, full affect  EKG:  EKG is ordered today. The ekg ordered today shows NSR 85 bpm, within normal limits.  Recent Labs: No results found for requested labs within last 365 days.   Lipid Panel       Component Value Date/Time   CHOL 187 06/08/2013 1437   TRIG 166.0* 06/08/2013 1437   HDL 60.50 06/08/2013 1437   CHOLHDL 3 06/08/2013 1437   VLDL 33.2 06/08/2013 1437   LDLCALC 93 06/08/2013 1437   LDLDIRECT 156.5 12/01/2011 0958      Wt Readings from Last 3 Encounters:  09/22/14 187 lb 12.8 oz (85.186 kg)  07/06/13 185 lb 9.6 oz (84.188 kg)  06/08/13 187 lb 3.2 oz (84.913 kg)     ASSESSMENT AND PLAN: 1.  CAD, native vessel: no angina. 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 He remains very active and has no cardiac limitation. Current medical therapy was reviewed and will continue without change.   2. Hyperlipidemia: Has been treated with crestor 20 mg daily. Most recent labs were done this spring as part of 'Doctor's Day' panel. He will send [place] copies of his labs but recalls lipids were in good range.   Current medicines are reviewed with the patient today.  The patient does not have concerns regarding medicines.  Labs/ tests ordered today include:  No orders of the defined types were placed in this encounter.   Disposition:   FU one year  Signed, Sherren Mocha, John Stein  09/22/2014 9:26 AM    [facility] [place], [place], [place]  [address] Phone: [phone]; Fax: [phone]

## 2014-09-22 ENCOUNTER — Ambulatory Visit (INDEPENDENT_AMBULATORY_CARE_PROVIDER_SITE_OTHER): Payer: PRIVATE HEALTH INSURANCE | Admitting: Cardiovascular Disease

## 2014-09-22 ENCOUNTER — Encounter: Payer: Self-pay | Admitting: Cardiovascular Disease

## 2014-09-22 VITALS — BP 130/76 | HR 85 | Ht 69.0 in | Wt 187.8 lb

## 2014-09-22 DIAGNOSIS — E785 Hyperlipidemia, unspecified: Secondary | ICD-10-CM

## 2014-09-22 DIAGNOSIS — I2510 Atherosclerotic heart disease of native coronary artery without angina pectoris: Secondary | ICD-10-CM | POA: Diagnosis not present

## 2014-09-22 DIAGNOSIS — I10 Essential (primary) hypertension: Secondary | ICD-10-CM

## 2014-09-22 NOTE — Patient Instructions (Signed)
Medication Instructions:  Your physician recommends that you continue on your current medications as directed. Please refer to the Current Medication list given to you today.  Labwork: No new orders.   Testing/Procedures: No new orders.   Follow-Up: Your physician wants you to follow-up in: 1 YEAR with Dr Burt Knack.  You will receive a reminder letter in the mail two months in advance. If you don't receive a letter, please call our office to schedule the follow-up appointment.  Please fax a copy of your lab results to [phone], Attn: Lauren RN  Any Other Special Instructions Will Be Listed Below (If Applicable).

## 2014-09-24 ENCOUNTER — Encounter: Payer: Self-pay | Admitting: Cardiovascular Disease

## 2014-10-02 ENCOUNTER — Other Ambulatory Visit: Payer: Self-pay

## 2014-10-02 MED ORDER — ROSUVASTATIN CALCIUM 20 MG PO TABS: 20.0000 mg | ORAL_TABLET | Freq: Every day | ORAL | Status: DC

## 2015-02-05 ENCOUNTER — Other Ambulatory Visit: Payer: Self-pay | Admitting: *Deleted

## 2015-02-05 NOTE — Telephone Encounter (Signed)
Open in error

## 2015-02-16 ENCOUNTER — Other Ambulatory Visit: Payer: Self-pay | Admitting: *Deleted

## 2015-02-16 DIAGNOSIS — E7800 Pure hypercholesterolemia, unspecified: Secondary | ICD-10-CM

## 2015-02-16 DIAGNOSIS — I2510 Atherosclerotic heart disease of native coronary artery without angina pectoris: Secondary | ICD-10-CM

## 2015-02-16 DIAGNOSIS — R079 Chest pain, unspecified: Secondary | ICD-10-CM

## 2015-02-16 MED ORDER — ROSUVASTATIN CALCIUM 20 MG PO TABS: 20.0000 mg | ORAL_TABLET | Freq: Every day | ORAL | Status: DC

## 2015-02-16 MED ORDER — ISOSORBIDE MONONITRATE ER 30 MG PO TB24: 30.0000 mg | ORAL_TABLET | Freq: Every day | ORAL | Status: DC

## 2015-02-16 MED ORDER — METOPROLOL SUCCINATE ER 50 MG PO TB24
ORAL_TABLET | ORAL | Status: DC
Start: 2015-02-16 — End: 2015-02-20

## 2015-02-16 NOTE — Telephone Encounter (Signed)
After visit Medications (3 Orders)  isosorbide mononitrate (IMDUR) 30 MG 24 hr tablet  Take 1 tablet (30 mg total) by mouth daily., Disp-90 tablet, R-1, Normal    metoprolol succinate (TOPROL-XL) 50 MG 24 hr tablet  TAKE 1 TABLET BY MOUTH DAILY, Disp-90 tablet, R-1, Normal    rosuvastatin (CRESTOR) 20 MG tablet  Take 1 tablet (20 mg total) by mouth at bedtime., Disp-90 tablet, R-1, Normal    HAD TO RESEND ALL SCRIPTS DUE TO BEING ON PRINT THE FIRST TIME.

## 2015-02-20 ENCOUNTER — Other Ambulatory Visit: Payer: Self-pay | Admitting: Cardiovascular Disease

## 2015-02-20 DIAGNOSIS — R079 Chest pain, unspecified: Secondary | ICD-10-CM

## 2015-02-20 DIAGNOSIS — I2510 Atherosclerotic heart disease of native coronary artery without angina pectoris: Secondary | ICD-10-CM

## 2015-02-20 DIAGNOSIS — E7800 Pure hypercholesterolemia, unspecified: Secondary | ICD-10-CM

## 2015-02-20 MED ORDER — ISOSORBIDE MONONITRATE ER 30 MG PO TB24: 30.0000 mg | ORAL_TABLET | Freq: Every day | ORAL | Status: DC

## 2015-02-20 MED ORDER — METOPROLOL SUCCINATE ER 50 MG PO TB24: ORAL_TABLET | ORAL | Status: DC

## 2015-02-20 MED ORDER — ROSUVASTATIN CALCIUM 20 MG PO TABS: 20.0000 mg | ORAL_TABLET | Freq: Every day | ORAL | Status: DC

## 2015-06-01 ENCOUNTER — Telehealth: Payer: Self-pay | Admitting: Cardiovascular Disease

## 2015-06-01 NOTE — Telephone Encounter (Signed)
Per epic, patient should have a refill available at [facility]. Spoke with [facility] and his medication has been ready for pick up since 05/29/15. Patient aware and thanked me for returning his call.

## 2015-06-01 NOTE — Telephone Encounter (Signed)
°*  STAT* If patient is at the pharmacy, call can be transferred to refill team.   1. Which medications need to be refilled? (please list name of each medication and dose if known) Crestor   2. Which pharmacy/location (including street and city if local pharmacy) is medication to be sent to?[facility]   3. Do they need a 30 day or 90 day supply? [place]

## 2015-08-23 ENCOUNTER — Telehealth: Payer: Self-pay | Admitting: Cardiovascular Disease

## 2015-08-23 NOTE — Telephone Encounter (Signed)
BECKY AWARE  LAUREN OUT OF OFFICE TODAY   WILL CALL LATER  WITH APPT  .PT PREFERS  Tuesday,WEDNESDAY  OR Friday  IF   POSSIBLE. PER  BECKY OKAY TO  LEAVE APPT  ON HER VOICE  MAIL IF  UNABLE TO REACH./CY

## 2015-08-23 NOTE — Telephone Encounter (Signed)
New Message  Dr Joya Salm office calling to sched f/u for July- did not want to sched w/ APP- requested to discuss w/ RN- Please call back and discuss.

## 2015-08-24 NOTE — Telephone Encounter (Signed)
Appointment scheduled on 09/28/15 at 9 AM with Dr Burt Knack.  I left this information on Becky's voicemail.

## 2015-09-05 ENCOUNTER — Other Ambulatory Visit: Payer: Self-pay | Admitting: Cardiovascular Disease

## 2015-09-25 ENCOUNTER — Other Ambulatory Visit: Payer: Self-pay | Admitting: Cardiovascular Disease

## 2015-09-25 MED ORDER — NITROGLYCERIN 0.4 MG SL SUBL: 0.4000 mg | SUBLINGUAL_TABLET | SUBLINGUAL | 3 refills | Status: DC | PRN

## 2015-09-28 ENCOUNTER — Ambulatory Visit (INDEPENDENT_AMBULATORY_CARE_PROVIDER_SITE_OTHER): Payer: PRIVATE HEALTH INSURANCE | Admitting: Cardiovascular Disease

## 2015-09-28 ENCOUNTER — Encounter (INDEPENDENT_AMBULATORY_CARE_PROVIDER_SITE_OTHER): Payer: Self-pay

## 2015-09-28 VITALS — BP 120/66 | HR 76 | Ht 68.0 in | Wt 181.4 lb

## 2015-09-28 DIAGNOSIS — I25118 Atherosclerotic heart disease of native coronary artery with other forms of angina pectoris: Secondary | ICD-10-CM

## 2015-09-28 MED ORDER — ISOSORBIDE MONONITRATE ER 60 MG PO TB24: 60.0000 mg | ORAL_TABLET | Freq: Every day | ORAL | 3 refills | Status: DC

## 2015-09-28 NOTE — Patient Instructions (Signed)
Medication Instructions:  Your physician has recommended you make the following change in your medication:  1. INCREASE Isosorbide MN to 60mg  take one tablet by mouth every morning  Labwork: No new orders.   Testing/Procedures: Your physician has requested that you have an exercise stress myoview. For further information please visit [email or web address]. Please follow instruction sheet, as given.  Follow-Up: Your physician wants you to follow-up in: 1 YEAR with Dr Burt Knack.  You will receive a reminder letter in the mail two months in advance. If you don't receive a letter, please call our office to schedule the follow-up appointment.   Any Other Special Instructions Will Be Listed Below (If Applicable).     If you need a refill on your cardiac medications before your next appointment, please call your pharmacy.

## 2015-09-28 NOTE — Progress Notes (Signed)
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 He had a high-risk stress test in 2013 and cardiac cath showed severe diagonal stenosis and he underwent PTCA at that time. He has had episodic angina associated with the stress of performing neurosurgery.   He reports increasing anginal symptoms over the past 6 months. Describes chest tightness radiating into the neck and jaw. Symptoms occur with stress during surgery and sometimes with physical activity. He is taking about 2 NTG every week. Compliant with medications. No dyspnea, edema, or other complaints. CP has resolved consistently with one SL NTG.   Past Medical History:  Diagnosis Date  . 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 CAD (coronary artery disease)    a. 12/11/2011 Ex MV: Med/Sev ant & antlat ischemia, EF 53%, 60mm ST dep V4-V6;  b. 12/11/2011 Cath/PTCA: LM nl, LAD 20-21m, D1 99ost (Tx w/ PTCA only), LCX 40/16m, RCA diff nonobs dzs, EF 55%, sublte dist antlat HK.    Marland Kitchen History of nephrolithiasis   . History of smallpox childhood  . HTN (hypertension)   . Hyperlipidemia   . Kidney stone ~ 2003  . Shingles rash 2011   right scalp    Past Surgical History:  Procedure Laterality Date  . CORONARY ANGIOPLASTY  12/11/2011  . LEFT HEART CATHETERIZATION WITH CORONARY ANGIOGRAM N/A 12/11/2011   Procedure: LEFT HEART CATHETERIZATION WITH CORONARY ANGIOGRAM;  Surgeon: Sherren Mocha, MD;  Location: [facility] CATH LAB;  Service: Cardiovascular;  Laterality: N/A;  . TONSILLECTOMY AND ADENOIDECTOMY  1972  . VASECTOMY  2009    Current Outpatient Prescriptions  Medication Sig Dispense Refill  . aspirin 81 MG chewable tablet Chew 1 tablet (81 mg  total) by mouth daily.    . CRESTOR 20 MG tablet TAKE 1 TABLET (20 MG TOTAL) BY MOUTH AT BEDTIME. 90 tablet 0  . isosorbide mononitrate (IMDUR) 60 MG 24 hr tablet Take 1 tablet (60 mg total) by mouth daily. 90 tablet 3  . metoprolol succinate (TOPROL-XL) 50 MG 24 hr tablet TAKE 1 TABLET BY MOUTH DAILY 90 tablet 0  . nitroGLYCERIN (NITROSTAT) 0.4 MG SL tablet Place 1 tablet (0.4 mg total) under the tongue every 5 (five) minutes as needed for chest pain. 25 tablet 3   No current facility-administered medications for this visit.     Allergies:   Review of patient's allergies indicates no known allergies.   Social History:  The patient  reports that he quit smoking about 28 years ago. His smoking use included Cigarettes. He has a 4.00 pack-year smoking history. He has never used smokeless tobacco. He reports that he drinks about 9.0 oz of alcohol per week . He reports that he does not use drugs.   Family History:  The patient's  family history includes AAA (abdominal aortic aneurysm) in his father.   ROS:  Please see the history of present illness.  All other systems are reviewed and negative.   PHYSICAL EXAM: VS:  BP 120/66   Pulse 76   Ht 5\' 8"  (1.727 m)   Wt 181 lb 6.4 oz (82.3 kg)   BMI 27.58 kg/m  , BMI Body  mass index is 27.58 kg/m. GEN: Well nourished, well developed, in no acute distress  HEENT: normal  Neck: no JVD, no masses. No carotid bruits Cardiac: RRR without murmur or gallop                Respiratory:  clear to auscultation bilaterally, normal work of breathing GI: soft, nontender, nondistended, + BS MS: no deformity or atrophy  Ext: no pretibial edema, pedal pulses 2+= bilaterally Skin: warm and dry, no rash Neuro:  Strength and sensation are intact Psych: euthymic mood, full affect  EKG:  EKG is ordered today. The ekg ordered today shows normal sinus rhythm 76 bpm, within normal limits.  Recent Labs: No results found for requested labs within last 8760 hours.     Lipid Panel     Component Value Date/Time   CHOL 187 06/08/2013 1437   TRIG 166.0 (H) 06/08/2013 1437   HDL 60.50 06/08/2013 1437   CHOLHDL 3 06/08/2013 1437   VLDL 33.2 06/08/2013 1437   LDLCALC 93 06/08/2013 1437   LDLDIRECT 156.5 12/01/2011 0958      Wt Readings from Last 3 Encounters:  09/28/15 181 lb 6.4 oz (82.3 kg)  09/22/14 187 lb 12.8 oz (85.2 kg)  07/06/13 185 lb 9.6 oz (84.2 kg)     Cardiac Studies Reviewed: GXT 2015 Exercise Tolerance Test Ordering MD: Sherren Mocha, MD  Interpreting MD: Sherren Mocha, MD  Unique Test No: 1  Treadmill:  1  Indication for ETT: known ASHD  Contraindication to ETT: No   Stress Modality: exercise - treadmill  Cardiac Imaging Performed: non   Protocol: standard Bruce - maximal  Max BP:  223/89  Max MPHR (bpm):  153 85% MPR (bpm):  130  MPHR obtained (bpm):  148 % MPHR obtained:  96  Reached 85% MPHR (min:sec):  9:17 Total Exercise Time (min-sec):  12:00  Workload in METS:  13.4 Borg Scale: 17  Reason ETT Terminated:  fatigue    ST Segment Analysis At Rest:                     normal ST segments - no evidence of significant ST depression With Exercise:           significant ischemic ST depression  Other Information Arrhythmia:  No                     Angina during ETT:  absent (0) Quality of ETT:  diagnostic  ETT Interpretation:  abnormal - evidence of ST depression consistent with ischemia  Comments: 1. Significant ST depression in Stage 4 of the Bruce Protocol, but upsloping ST segments with rapid resolution in early recovery 2. No angina or arrhythmia with exertion 3. Hypertensive responsive to exercise (beta-blocker held for test) 4. Excellent exercise tolerance   Recommendations: Continue current medical program. No angina and improved exercise tolerance from last year.  Cardiac catheterization 12/11/2011: PROCEDURAL FINDINGS Hemodynamics: AO 144/80 LV 140/22                        Coronary  angiography: Coronary dominance: right  Left mainstem: Widely patent without obstructive disease  Left anterior descending (LAD): The LAD has proximal ectasia. There is no significant stenosis. The mid vessel at the origin of the first diagonal has 20-30% stenosis. There are diffuse irregularities throughout the remaining portions of the LAD. The first diagonal is large in caliber and it is subtotally occluded with a  99% ostial stenosis. The distal branch vessels of the diagonal fills competitively from antegrade and retrograde collateral flow from the right coronary artery.  Left circumflex (LCx): There is a small intermediate branch with no significant stenosis. The AV groove circumflex is widely patent in the proximal and mid portions. The vessel supplies a large atrial branch. Beyond the atrial branch and the first obtuse marginal there is 40% stenosis at the origin of the OM and then 60% stenosis in the mid body of the OM.  Right coronary artery (RCA): This is a large, dominant vessel. There is diffuse nonobstructive disease throughout. The vessel is widely patent through the proximal, mid, and distal portions. The PDA is very large without significant stenosis. There are 2 small posterolateral branches without significant disease. There are collaterals supplying the distal branch vessels of the first diagonal peer  Left ventriculography: Left ventricular systolic function is preserved. The estimated left ventricular ejection fraction is 55%. There is subtle hypokinesis of the distal anterolateral wall.  PCI Note:  Following the diagnostic procedure, the decision was made to proceed with PCI. The radial sheath was upsized to a 6 [place]. Weight-based bivalirudin was given for anticoagulation. The patient was loaded with 600 mg of Plavix. Once a therapeutic ACT was achieved, a 6 [place] XB LAD 3.5 cm guide catheter was inserted.  A cougar coronary guidewire was initially used but I was unable to  cross the lesion. I changed out to a fielder XT wire. This was successful at crossing the lesion.  The lesion was predilated with a 2.5 x 12 mm balloon.  The lesion was then dilated with a 2.5 x 10 mm cutting balloon.  Because ostial location of the lesion and the angulation as it originates from the LAD, I did not feel that stenting was an option.  Following PCI, there was 30% residual stenosis and TIMI-3 flow. Final angiography confirmed an excellent result. The patient tolerated the procedure well. There were no immediate procedural complications. A TR band was used for radial hemostasis. The patient was transferred to the post catheterization recovery area for further monitoring.  PCI Data: Vessel -  diagonal 1 /Segment -  ostial  Percent Stenosis (pre)   99  TIMI-flow  3 Stent  none  Percent Stenosis (post)  30  TIMI-flow (post)  3   Final Conclusions:   1. Severe diagonal stenosis treated successfully with balloon angioplasty 2. Moderate LCx stenosis 3. Mild nonobstructive RCA and LAD stenoses 4. Preserved LV function  Recommendations:  Aggressive medical therapy.  Sherren Mocha 12/11/2011, 7:04 PM  ASSESSMENT AND PLAN: 1.  CAD, native vessel, with CCS class II angina: Patient reports increased frequency and intensity of his anginal symptoms. This is occurring about twice per week. Sometimes it is with emotional stress and other times with physical activity. His symptoms are nitroglycerin responsive to a single sublingual nitroglycerin. He's been compliant with his medications. Will increase isosorbide to 60 mg daily and asked him to take it in the morning as most of his symptoms are during the daytime hours. Will order an exercise stress Myoview for risk stratification and to help with decision on whether we need to proceed again with cardiac catheterization. His 2013 catheterization study was reviewed.  2. Hyperlipidemia: Continues on Crestor 20 mg  Current medicines are  reviewed with the patient today.  The patient does not have concerns regarding medicines.  Labs/ tests ordered today include:   Orders Placed This Encounter  Procedures  . Myocardial  Perfusion Imaging  . EKG 12-Lead    Disposition:   FU after stress test.  Signed, Sherren Mocha, MD  09/30/2015 9:00 AM    [facility] [place], [place], [place]  [address] Phone: [phone]; Fax: [phone]

## 2015-09-30 ENCOUNTER — Encounter: Payer: Self-pay | Admitting: Cardiovascular Disease

## 2015-10-01 ENCOUNTER — Telehealth (HOSPITAL_COMMUNITY): Payer: Self-pay | Admitting: *Deleted

## 2015-10-01 NOTE — Telephone Encounter (Signed)
Patient given detailed instructions per Myocardial Perfusion Study Information Sheet for the test on 10/03/15 at 9:45. Patient notified to arrive 15 minutes early and that it is imperative to arrive on time for appointment to keep from having the test rescheduled.  If you need to cancel or reschedule your appointment, please call the office within 24 hours of your appointment. Failure to do so may result in a cancellation of your appointment, and a $50 no show fee. Patient verbalized understanding.John Stein

## 2015-10-03 ENCOUNTER — Ambulatory Visit (HOSPITAL_COMMUNITY): Payer: PRIVATE HEALTH INSURANCE | Attending: Cardiovascular Disease

## 2015-10-03 DIAGNOSIS — R0789 Other chest pain: Secondary | ICD-10-CM | POA: Diagnosis not present

## 2015-10-03 DIAGNOSIS — I25118 Atherosclerotic heart disease of native coronary artery with other forms of angina pectoris: Secondary | ICD-10-CM | POA: Diagnosis not present

## 2015-10-03 DIAGNOSIS — I10 Essential (primary) hypertension: Secondary | ICD-10-CM | POA: Insufficient documentation

## 2015-10-03 LAB — MYOCARDIAL PERFUSION IMAGING
CHL CUP NUCLEAR SDS: 4
CHL CUP NUCLEAR SRS: 2
CHL CUP RESTING HR STRESS: 57 {beats}/min
CSEPED: 12 min
CSEPHR: 95 %
CSEPPHR: 144 {beats}/min
Estimated workload: 14.3 METS
Exercise duration (sec): 30 s
LHR: 0.29
LV dias vol: 127 mL (ref 62–150)
LVSYSVOL: 65 mL
MPHR: 151 {beats}/min
SSS: 6
TID: 0.92

## 2015-10-03 MED ORDER — TECHNETIUM TC 99M TETROFOSMIN IV KIT
33.0000 | PACK | Freq: Once | INTRAVENOUS | Status: AC | PRN
  Administered 2015-10-03: 33 via INTRAVENOUS
  Filled 2015-10-03: qty 33

## 2015-10-03 MED ORDER — TECHNETIUM TC 99M TETROFOSMIN IV KIT
10.4000 | PACK | Freq: Once | INTRAVENOUS | Status: AC | PRN
  Administered 2015-10-03: 10 via INTRAVENOUS
  Filled 2015-10-03: qty 10

## 2015-11-09 ENCOUNTER — Telehealth: Payer: Self-pay | Admitting: Cardiovascular Disease

## 2015-11-09 MED ORDER — CRESTOR 20 MG PO TABS: 20.0000 mg | ORAL_TABLET | Freq: Every day | ORAL | 3 refills | Status: DC

## 2015-11-09 NOTE — Telephone Encounter (Signed)
Advised Becky to inform [name] rx sent as requested.

## 2015-11-09 NOTE — Telephone Encounter (Signed)
New message   Pt c/o medication issue:  1. Name of Medication: crestor   2. How are you currently taking this medication (dosage and times per day)? 20 mg   3. Are you having a reaction (difficulty breathing--STAT)? With the gentric having muscle pain.  4. What is your medication issue? Patient is asking for brand name to be called in [facility]

## 2015-12-03 ENCOUNTER — Other Ambulatory Visit: Payer: Self-pay | Admitting: Cardiovascular Disease

## 2015-12-03 MED ORDER — CRESTOR 20 MG PO TABS: 20.0000 mg | ORAL_TABLET | Freq: Every day | ORAL | 2 refills | Status: DC

## 2015-12-06 ENCOUNTER — Other Ambulatory Visit: Payer: Self-pay | Admitting: Cardiovascular Disease

## 2015-12-06 ENCOUNTER — Telehealth: Payer: Self-pay | Admitting: Cardiovascular Disease

## 2015-12-06 MED ORDER — ROSUVASTATIN CALCIUM 20 MG PO TABS: 20.0000 mg | ORAL_TABLET | Freq: Every day | ORAL | 2 refills | Status: DC

## 2015-12-06 NOTE — Telephone Encounter (Signed)
°*  STAT* If patient is at the pharmacy, call can be transferred to refill team.   1. Which medications need to be refilled? (please list name of each medication and dose if known) Pt need the generic Crestor,Crestor was sent in on 12-03-15  2. Which pharmacy/location (including street and city if local pharmacy) is medication to be sent to?[facility] [phone]  3. Do they need a 30 day or 90 day supply? 90 and refills

## 2015-12-06 NOTE — Telephone Encounter (Signed)
rx sent

## 2015-12-27 ENCOUNTER — Other Ambulatory Visit: Payer: Self-pay | Admitting: *Deleted

## 2015-12-27 MED ORDER — NITROGLYCERIN 0.4 MG SL SUBL: 0.4000 mg | SUBLINGUAL_TABLET | SUBLINGUAL | 3 refills | Status: DC | PRN

## 2016-10-01 ENCOUNTER — Other Ambulatory Visit: Payer: Self-pay | Admitting: Cardiovascular Disease

## 2016-11-14 ENCOUNTER — Encounter: Payer: Self-pay | Admitting: Cardiovascular Disease

## 2016-11-14 ENCOUNTER — Ambulatory Visit (INDEPENDENT_AMBULATORY_CARE_PROVIDER_SITE_OTHER): Payer: Medicare Other | Admitting: Cardiovascular Disease

## 2016-11-14 ENCOUNTER — Encounter (INDEPENDENT_AMBULATORY_CARE_PROVIDER_SITE_OTHER): Payer: Self-pay

## 2016-11-14 VITALS — BP 136/78 | HR 72 | Ht 68.0 in | Wt 180.0 lb

## 2016-11-14 DIAGNOSIS — E785 Hyperlipidemia, unspecified: Secondary | ICD-10-CM

## 2016-11-14 DIAGNOSIS — I209 Angina pectoris, unspecified: Secondary | ICD-10-CM

## 2016-11-14 DIAGNOSIS — I25119 Atherosclerotic heart disease of native coronary artery with unspecified angina pectoris: Secondary | ICD-10-CM

## 2016-11-14 DIAGNOSIS — I10 Essential (primary) hypertension: Secondary | ICD-10-CM | POA: Diagnosis not present

## 2016-11-14 MED ORDER — NITROGLYCERIN 0.4 MG SL SUBL: 0.4000 mg | SUBLINGUAL_TABLET | SUBLINGUAL | 3 refills | Status: DC | PRN

## 2016-11-14 NOTE — Patient Instructions (Signed)
Medication Instructions:  Your provider recommends that you continue on your current medications as directed. Please refer to the Current Medication list given to you today.    Labwork: Your provider recommends that you return for FASTING lab work.    Testing/Procedures: None  Follow-Up: Your provider wants you to follow-up in: 1 year with Dr. Burt Knack. You will receive a reminder letter in the mail two months in advance. If you don't receive a letter, please call our office to schedule the follow-up appointment.    Any Other Special Instructions Will Be Listed Below (If Applicable).     If you need a refill on your cardiac medications before your next appointment, please call your pharmacy.

## 2016-11-14 NOTE — Progress Notes (Signed)
Cardiology Office Note Date:  11/14/2016   ID:  John Cha, MD, DOB 21-Dec-1945, MRN [id number]  PCP:  Janith Lima, MD  Cardiologist:  Sherren Mocha, MD    Chief Complaint  Patient presents with  . 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 He had a high-risk stress test in 2013 and cardiac cath showed severe diagonal stenosis and he underwent PTCA at that time.   When I saw him last 1 year ago he complained of increasing angina. His isosorbide was increased and he underwent an exercise stress Myoview scan.   He is here alone today. He feels great. He retired at the end of last year. He's had no anginal symptoms since retirement. He specifically denies chest pain, chest pressure, shortness of breath, or heart palpitations. He continues to travel frequently all over the world and does a lot of hiking. He is going to [place] next week.  Past Medical History:  Diagnosis Date  . 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 CAD (coronary artery disease)    a. 12/11/2011 Ex MV: Med/Sev ant & antlat ischemia, EF 53%, 32mm ST dep V4-V6;  b. 12/11/2011 Cath/PTCA: LM nl, LAD 20-26m, D1 99ost (Tx w/ PTCA only), LCX 40/55m, RCA diff nonobs dzs, EF 55%, sublte dist antlat HK.    Marland Kitchen History of nephrolithiasis   . History of smallpox childhood  . HTN (hypertension)   . Hyperlipidemia   . Kidney stone ~ 2003  . Shingles rash 2011   right scalp    Past Surgical History:  Procedure Laterality Date  . CORONARY ANGIOPLASTY  12/11/2011  . LEFT HEART CATHETERIZATION WITH CORONARY ANGIOGRAM N/A 12/11/2011   Procedure: LEFT HEART CATHETERIZATION WITH CORONARY ANGIOGRAM;  Surgeon: Sherren Mocha, MD;  Location: [facility] CATH LAB;  Service: Cardiovascular;  Laterality: N/A;  . TONSILLECTOMY AND ADENOIDECTOMY  1972  . VASECTOMY  2009    Current Outpatient Prescriptions  Medication Sig Dispense Refill    . aspirin 81 MG chewable tablet Chew 1 tablet (81 mg total) by mouth daily.    . isosorbide mononitrate (IMDUR) 60 MG 24 hr tablet TAKE 1 TABLET BY MOUTH DAILY. 30 tablet 1  . metoprolol succinate (TOPROL-XL) 50 MG 24 hr tablet TAKE 1 TABLET BY MOUTH DAILY 90 tablet 2  . nitroGLYCERIN (NITROSTAT) 0.4 MG SL tablet Place 1 tablet (0.4 mg total) under the tongue every 5 (five) minutes as needed for chest pain. 25 tablet 3  . rosuvastatin (CRESTOR) 20 MG tablet Take 1 tablet (20 mg total) by mouth daily. 90 tablet 2   No current facility-administered medications for this visit.     Allergies:   Patient has no known allergies.   Social History:  The patient  reports that he quit smoking about 29 years ago. His smoking use included Cigarettes. He has a 4.00 pack-year smoking history. He has never used smokeless tobacco. He reports that he drinks about 9.0 oz of alcohol per week . He reports that he does not use drugs.   Family History:  The patient's  family history includes AAA (abdominal aortic aneurysm) in his father.    ROS:  Please see the history of present illness.  All other systems are reviewed and negative.    PHYSICAL EXAM: VS:  BP 136/78   Pulse 72   Ht 5\' 8"  (1.727 m)  Wt 81.6 kg (180 lb)   BMI 27.37 kg/m  , BMI Body mass index is 27.37 kg/m. GEN: Well nourished, well developed, in no acute distress  HEENT: normal  Neck: no JVD, no masses. No carotid bruits Cardiac: RRR without murmur or gallop                Respiratory:  clear to auscultation bilaterally, normal work of breathing GI: soft, nontender, nondistended, + BS MS: no deformity or atrophy  Ext: no pretibial edema, pedal pulses 2+= bilaterally Skin: warm and dry, no rash Neuro:  Strength and sensation are intact Psych: euthymic mood, full affect  EKG:  EKG is ordered today. The ekg ordered today shows normal sinus rhythm 72 bpm, within normal limits.  Recent Labs: No results found for requested labs  within last 8760 hours.   Lipid Panel     Component Value Date/Time   CHOL 187 06/08/2013 1437   TRIG 166.0 (H) 06/08/2013 1437   HDL 60.50 06/08/2013 1437   CHOLHDL 3 06/08/2013 1437   VLDL 33.2 06/08/2013 1437   LDLCALC 93 06/08/2013 1437   LDLDIRECT 156.5 12/01/2011 0958      Wt Readings from Last 3 Encounters:  11/14/16 81.6 kg (180 lb)  09/28/15 82.3 kg (181 lb 6.4 oz)  09/22/14 85.2 kg (187 lb 12.8 oz)     Cardiac Studies Reviewed: Myoview 10-03-2015: Study Highlights     Nuclear stress EF: 49%.  Blood pressure demonstrated a hypertensive response to exercise.  There was no ST segment deviation noted during stress.  This is a low risk study.  The left ventricular ejection fraction is mildly decreased (45-54%).   Normal resting and stress perfusion. No ischemia or infarction EF 49% but visually looks normal. HTN response to exercise     ASSESSMENT AND PLAN: 1.  CAD, native vessel, with angina: Doing very well since retirement. Isosorbide was increased last year and this is helped as well. He continues on metoprolol, aspirin, and a statin drug. No changes are made today.  2. HTN: Blood pressure is well controlled on metoprolol succinate.  3. Hyperlipidemia: The patient is treated with a statin drug. He is due for labs and fasting labs will be ordered.  Current medicines are reviewed with the patient today.  The patient does not have concerns regarding medicines.  Labs/ tests ordered today include:   Orders Placed This Encounter  Procedures  . Comprehensive metabolic panel  . CBC with Differential/Platelet  . Lipid panel  . EKG 12-Lead    Disposition:   FU one year  Signed, Sherren Mocha, MD  11/14/2016 11:05 AM    [facility] [place], [place], [place]  [address] Phone: [phone]; Fax: [phone]

## 2016-11-18 ENCOUNTER — Other Ambulatory Visit: Payer: Medicare Other

## 2016-11-18 DIAGNOSIS — E785 Hyperlipidemia, unspecified: Secondary | ICD-10-CM | POA: Diagnosis not present

## 2016-11-18 LAB — LIPID PANEL
CHOL/HDL RATIO: 3.3 ratio (ref 0.0–5.0)
CHOLESTEROL TOTAL: 183 mg/dL (ref 100–199)
HDL: 55 mg/dL (ref >39)
LDL CALC: 93 mg/dL (ref 0–99)
TRIGLYCERIDES: 173 mg/dL — AB (ref 0–149)
VLDL CHOLESTEROL CAL: 35 mg/dL (ref 5–40)

## 2016-11-18 LAB — CBC WITH DIFFERENTIAL/PLATELET
BASOS ABS: 0 10*3/uL (ref 0.0–0.2)
Basos: 0 %
EOS (ABSOLUTE): 0.1 10*3/uL (ref 0.0–0.4)
EOS: 3 %
HEMATOCRIT: 45 % (ref 37.5–51.0)
HEMOGLOBIN: 15.2 g/dL (ref 13.0–17.7)
IMMATURE GRANS (ABS): 0 10*3/uL (ref 0.0–0.1)
Immature Granulocytes: 0 %
LYMPHS ABS: 1.5 10*3/uL (ref 0.7–3.1)
LYMPHS: 30 %
MCH: 31.2 pg (ref 26.6–33.0)
MCHC: 33.8 g/dL (ref 31.5–35.7)
MCV: 92 fL (ref 79–97)
MONOCYTES: 9 %
Monocytes Absolute: 0.5 10*3/uL (ref 0.1–0.9)
Neutrophils Absolute: 2.9 10*3/uL (ref 1.4–7.0)
Neutrophils: 58 %
Platelets: 207 10*3/uL (ref 150–379)
RBC: 4.87 x10E6/uL (ref 4.14–5.80)
RDW: 14.2 % (ref 12.3–15.4)
WBC: 5.1 10*3/uL (ref 3.4–10.8)

## 2016-11-18 LAB — COMPREHENSIVE METABOLIC PANEL
ALBUMIN: 4.2 g/dL (ref 3.5–4.8)
ALT: 29 IU/L (ref 0–44)
AST: 25 IU/L (ref 0–40)
Albumin/Globulin Ratio: 1.8 (ref 1.2–2.2)
Alkaline Phosphatase: 60 IU/L (ref 39–117)
BUN / CREAT RATIO: 18 (ref 10–24)
BUN: 15 mg/dL (ref 8–27)
Bilirubin Total: 0.5 mg/dL (ref 0.0–1.2)
CO2: 24 mmol/L (ref 20–29)
CREATININE: 0.82 mg/dL (ref 0.76–1.27)
Calcium: 9.3 mg/dL (ref 8.6–10.2)
Chloride: 105 mmol/L (ref 96–106)
GFR calc non Af Amer: 90 mL/min/{1.73_m2} (ref >59)
GFR, EST AFRICAN AMERICAN: 104 mL/min/{1.73_m2} (ref >59)
GLUCOSE: 85 mg/dL (ref 65–99)
Globulin, Total: 2.3 g/dL (ref 1.5–4.5)
Potassium: 5.3 mmol/L — ABNORMAL HIGH (ref 3.5–5.2)
Sodium: 142 mmol/L (ref 134–144)
TOTAL PROTEIN: 6.5 g/dL (ref 6.0–8.5)

## 2016-12-04 ENCOUNTER — Other Ambulatory Visit: Payer: Self-pay | Admitting: Cardiovascular Disease

## 2016-12-11 DIAGNOSIS — H2513 Age-related nuclear cataract, bilateral: Secondary | ICD-10-CM | POA: Diagnosis not present

## 2017-07-22 ENCOUNTER — Ambulatory Visit (INDEPENDENT_AMBULATORY_CARE_PROVIDER_SITE_OTHER): Payer: Medicare Other | Admitting: Family Medicine

## 2017-07-22 ENCOUNTER — Encounter: Payer: Self-pay | Admitting: Family Medicine

## 2017-07-22 VITALS — BP 124/74 | HR 72 | Temp 98.5°F | Ht 68.0 in | Wt 171.8 lb

## 2017-07-22 DIAGNOSIS — Z87891 Personal history of nicotine dependence: Secondary | ICD-10-CM | POA: Diagnosis not present

## 2017-07-22 DIAGNOSIS — I10 Essential (primary) hypertension: Secondary | ICD-10-CM | POA: Diagnosis not present

## 2017-07-22 DIAGNOSIS — Z125 Encounter for screening for malignant neoplasm of prostate: Secondary | ICD-10-CM | POA: Diagnosis not present

## 2017-07-22 DIAGNOSIS — Z23 Encounter for immunization: Secondary | ICD-10-CM | POA: Diagnosis not present

## 2017-07-22 DIAGNOSIS — E785 Hyperlipidemia, unspecified: Secondary | ICD-10-CM | POA: Diagnosis not present

## 2017-07-22 LAB — COMPREHENSIVE METABOLIC PANEL
ALBUMIN: 4.3 g/dL (ref 3.5–5.2)
ALT: 17 U/L (ref 0–53)
AST: 17 U/L (ref 0–37)
Alkaline Phosphatase: 72 U/L (ref 39–117)
BILIRUBIN TOTAL: 0.7 mg/dL (ref 0.2–1.2)
BUN: 24 mg/dL — AB (ref 6–23)
CALCIUM: 9.5 mg/dL (ref 8.4–10.5)
CO2: 33 mEq/L — ABNORMAL HIGH (ref 19–32)
CREATININE: 0.84 mg/dL (ref 0.40–1.50)
Chloride: 103 mEq/L (ref 96–112)
GFR: 95.72 mL/min (ref >60.00)
Glucose, Bld: 118 mg/dL — ABNORMAL HIGH (ref 70–99)
Potassium: 5.2 mEq/L — ABNORMAL HIGH (ref 3.5–5.1)
SODIUM: 142 meq/L (ref 135–145)
Total Protein: 6.7 g/dL (ref 6.0–8.3)

## 2017-07-22 LAB — CBC WITH DIFFERENTIAL/PLATELET
BASOS ABS: 0 10*3/uL (ref 0.0–0.1)
Basophils Relative: 0.7 % (ref 0.0–3.0)
EOS ABS: 0.3 10*3/uL (ref 0.0–0.7)
Eosinophils Relative: 5.9 % — ABNORMAL HIGH (ref 0.0–5.0)
HEMATOCRIT: 44.9 % (ref 39.0–52.0)
HEMOGLOBIN: 15.2 g/dL (ref 13.0–17.0)
LYMPHS PCT: 29.3 % (ref 12.0–46.0)
Lymphs Abs: 1.4 10*3/uL (ref 0.7–4.0)
MCHC: 33.9 g/dL (ref 30.0–36.0)
MCV: 91.4 fl (ref 78.0–100.0)
Monocytes Absolute: 0.4 10*3/uL (ref 0.1–1.0)
Monocytes Relative: 8.2 % (ref 3.0–12.0)
Neutro Abs: 2.6 10*3/uL (ref 1.4–7.7)
Neutrophils Relative %: 55.9 % (ref 43.0–77.0)
Platelets: 206 10*3/uL (ref 150.0–400.0)
RBC: 4.91 Mil/uL (ref 4.22–5.81)
RDW: 14.8 % (ref 11.5–15.5)
WBC: 4.7 10*3/uL (ref 4.0–10.5)

## 2017-07-22 LAB — LIPID PANEL
CHOL/HDL RATIO: 3
Cholesterol: 179 mg/dL (ref 0–200)
HDL: 56.7 mg/dL (ref >39.00)
LDL Cholesterol: 109 mg/dL — ABNORMAL HIGH (ref 0–99)
NonHDL: 122.45
TRIGLYCERIDES: 66 mg/dL (ref 0.0–149.0)
VLDL: 13.2 mg/dL (ref 0.0–40.0)

## 2017-07-22 LAB — MICROALBUMIN / CREATININE URINE RATIO
CREATININE, U: 165.5 mg/dL
MICROALB UR: 0.9 mg/dL (ref 0.0–1.9)
MICROALB/CREAT RATIO: 0.6 mg/g (ref 0.0–30.0)

## 2017-07-22 LAB — PSA: PSA: 3.77 ng/mL (ref 0.10–4.00)

## 2017-07-22 NOTE — Progress Notes (Signed)
Patient: John Mukai, MD MRN: [id number] DOB: 21-Jan-1946 PCP: Orma Flaming, MD     Subjective:  Chief Complaint  Patient presents with  . Establish Care    HPI: The patient is a 72 y.o. male who presents today to establish care. He has a history of CAD with stent placed in 2013. Followed by cardiology yearly, Dr. Burt Knack. Hx of hyperlipidemia, HTN and remote hx of OSA. No longer wears CPAP and was told by physician he no longer needs this. He is very active. Retired Publishing rights manager. Upcoming trip to [place] and [place] and [place]. Declines any malarial medication or other travelers medicine.   Hypertension: Here for follow up of hypertension.  Currently on toprol-XL . Takes medication as prescribed and denies any side effects. Exercise includes hiking. Weight has been stable. Denies any chest pain, headaches, shortness of breath, vision changes, swelling in lower extremities.    CAD/hyperlipidemia: currently on statin with hx of stent in 2013. Checking labs today. Tolerating statin therapy with no issues. Very active.   Hx of tobacco abuse in the past. Hx of AAA in father. Never had a AAA screen done.   Review of Systems  Constitutional: Negative for chills and fever.  HENT: Negative for congestion and sore throat.   Eyes: Negative for pain and visual disturbance.  Respiratory: Negative for cough, chest tightness and shortness of breath.   Cardiovascular: Negative for chest pain and palpitations.  Gastrointestinal: Negative for abdominal pain, constipation, diarrhea and nausea.  Endocrine: Negative for cold intolerance.  Musculoskeletal: Negative for back pain and neck pain.  Skin: Negative for wound.  Neurological: Negative for dizziness, weakness and numbness.  Hematological: Does not bruise/bleed easily.  Psychiatric/Behavioral: Negative for behavioral problems, hallucinations and suicidal ideas.    Allergies Patient has No Known Allergies.  Past Medical History Patient   has a past medical history of Basal cell carcinoma of dorsum of nose, CAD (coronary artery disease), History of nephrolithiasis, History of smallpox (childhood), HTN (hypertension), Hyperlipidemia, Kidney stone (~ 2003), and Shingles rash (2011).  Surgical History Patient  has a past surgical history that includes Tonsillectomy and adenoidectomy (1972); Vasectomy (2009); Coronary angioplasty (12/11/2011); and left heart catheterization with coronary angiogram (N/A, 12/11/2011).  Family History Pateint's family history includes AAA (abdominal aortic aneurysm) in his father.  Social History Patient  reports that he quit smoking about 30 years ago. His smoking use included cigarettes. He has a 4.00 pack-year smoking history. He has never used smokeless tobacco. He reports that he drinks about 9.0 oz of alcohol per week. He reports that he does not use drugs.    Objective: Vitals:   07/22/17 1058  BP: 124/74  Pulse: 72  Temp: 98.5 F (36.9 C)  TempSrc: Oral  SpO2: 98%  Weight: 171 lb 12.8 oz (77.9 kg)  Height: 5\' 8"  (1.727 m)    Body mass index is 26.12 kg/m.  Physical Exam  Constitutional: He is oriented to person, place, and time. He appears well-developed and well-nourished.  HENT:  Right Ear: External ear normal.  Left Ear: External ear normal.  Mouth/Throat: Oropharynx is clear and moist.  Mild cobblestoning on posterior pharynax  Bilateral TM wnl.   Eyes: Pupils are equal, round, and reactive to light. Conjunctivae and EOM are normal.  Neck: Normal range of motion. Neck supple. No thyromegaly present.  Cardiovascular: Normal rate, regular rhythm, normal heart sounds and intact distal pulses.  No murmur heard. No carotid bruits   Pulmonary/Chest: Effort normal and breath sounds normal.  Abdominal: Soft. Bowel sounds are normal. He exhibits no distension. There is no tenderness.  Lymphadenopathy:    He has no cervical adenopathy.  Neurological: He is alert and oriented to  person, place, and time. He displays normal reflexes. Coordination normal.  Skin: Skin is warm and dry. No rash noted.  Psychiatric: He has a normal mood and affect. His behavior is normal.  Vitals reviewed.      Assessment/plan: 1. Essential hypertension Blood pressure is to goal. Continue current anti-hypertensive medications. No refills given and routine lab work will be done today. Recommended routine exercise and healthy diet including DASH diet and mediterranean diet. Encouraged weight loss. F/u in 12 months.   - CBC with Differential/Platelet - Comprehensive metabolic panel - Lipid panel - Microalbumin / creatinine urine ratio  2. Hyperlipidemia with target low density lipoprotein (LDL) cholesterol less than 70 mg/dL On statin therapy and followed by cardiology. Check labs today. Continue medication. No refills needed as he gets this from cardiology. Continue active lifestyle.  - Lipid panel  3. Need for vaccination for Strep pneumoniae  - Pneumococcal conjugate vaccine 13-valent  4. Screening PSA (prostate specific antigen)  - PSA  5. History of tobacco use  - US ABDOMINAL AORTA SCREENING AAA; Future   Return in about 1 year (around 07/23/2018).     Orma Flaming, MD [place]  07/22/2017

## 2017-07-23 ENCOUNTER — Other Ambulatory Visit: Payer: Self-pay | Admitting: Family Medicine

## 2017-07-23 ENCOUNTER — Other Ambulatory Visit (INDEPENDENT_AMBULATORY_CARE_PROVIDER_SITE_OTHER): Payer: Medicare Other

## 2017-07-23 DIAGNOSIS — R7309 Other abnormal glucose: Secondary | ICD-10-CM

## 2017-07-23 LAB — HEMOGLOBIN A1C: HEMOGLOBIN A1C: 5.7 % (ref 4.6–6.5)

## 2017-07-23 NOTE — Progress Notes (Signed)
Noted. Faxed request to add on lab.

## 2017-07-24 ENCOUNTER — Encounter: Payer: Self-pay | Admitting: Family Medicine

## 2017-07-24 DIAGNOSIS — R7303 Prediabetes: Secondary | ICD-10-CM | POA: Insufficient documentation

## 2017-07-29 ENCOUNTER — Other Ambulatory Visit: Payer: Self-pay | Admitting: Family Medicine

## 2017-07-29 DIAGNOSIS — R7303 Prediabetes: Secondary | ICD-10-CM

## 2017-10-16 ENCOUNTER — Telehealth: Payer: Self-pay | Admitting: Family Medicine

## 2017-10-16 NOTE — Telephone Encounter (Signed)
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 The order expired so I may need to do another one when he is back in country. Also don't forget labs in November.  Hope his travels are going well.

## 2017-10-21 ENCOUNTER — Other Ambulatory Visit: Payer: Self-pay | Admitting: Family Medicine

## 2017-10-21 DIAGNOSIS — Z87891 Personal history of nicotine dependence: Secondary | ICD-10-CM

## 2017-10-21 DIAGNOSIS — Z136 Encounter for screening for cardiovascular disorders: Secondary | ICD-10-CM

## 2017-10-21 NOTE — Addendum Note (Signed)
Addended by: Orma Flaming on: 10/21/2017 08:19 AM   Modules accepted: Orders

## 2017-11-10 ENCOUNTER — Other Ambulatory Visit: Payer: Self-pay | Admitting: Cardiovascular Disease

## 2017-11-11 ENCOUNTER — Other Ambulatory Visit: Payer: Self-pay | Admitting: Cardiovascular Disease

## 2017-12-23 ENCOUNTER — Other Ambulatory Visit: Payer: Self-pay | Admitting: Family Medicine

## 2017-12-23 ENCOUNTER — Encounter: Payer: Self-pay | Admitting: Family Medicine

## 2017-12-23 DIAGNOSIS — Z136 Encounter for screening for cardiovascular disorders: Secondary | ICD-10-CM

## 2017-12-23 DIAGNOSIS — Z720 Tobacco use: Secondary | ICD-10-CM

## 2018-02-01 ENCOUNTER — Encounter: Payer: Self-pay | Admitting: Cardiovascular Disease

## 2018-02-01 ENCOUNTER — Ambulatory Visit (INDEPENDENT_AMBULATORY_CARE_PROVIDER_SITE_OTHER): Payer: Medicare Other | Admitting: Cardiovascular Disease

## 2018-02-01 VITALS — BP 136/82 | HR 64 | Ht 68.0 in | Wt 180.0 lb

## 2018-02-01 DIAGNOSIS — I25119 Atherosclerotic heart disease of native coronary artery with unspecified angina pectoris: Secondary | ICD-10-CM | POA: Diagnosis not present

## 2018-02-01 DIAGNOSIS — I10 Essential (primary) hypertension: Secondary | ICD-10-CM | POA: Diagnosis not present

## 2018-02-01 DIAGNOSIS — Z8249 Family history of ischemic heart disease and other diseases of the circulatory system: Secondary | ICD-10-CM | POA: Diagnosis not present

## 2018-02-01 DIAGNOSIS — E782 Mixed hyperlipidemia: Secondary | ICD-10-CM

## 2018-02-01 LAB — COMPREHENSIVE METABOLIC PANEL
ALK PHOS: 75 IU/L (ref 39–117)
ALT: 32 IU/L (ref 0–44)
AST: 28 IU/L (ref 0–40)
Albumin/Globulin Ratio: 2 (ref 1.2–2.2)
Albumin: 4.3 g/dL (ref 3.5–4.8)
BILIRUBIN TOTAL: 0.7 mg/dL (ref 0.0–1.2)
BUN/Creatinine Ratio: 14 (ref 10–24)
BUN: 13 mg/dL (ref 8–27)
CHLORIDE: 100 mmol/L (ref 96–106)
CO2: 26 mmol/L (ref 20–29)
CREATININE: 0.94 mg/dL (ref 0.76–1.27)
Calcium: 9.4 mg/dL (ref 8.6–10.2)
GFR calc Af Amer: 94 mL/min/{1.73_m2} (ref >59)
GFR calc non Af Amer: 81 mL/min/{1.73_m2} (ref >59)
GLOBULIN, TOTAL: 2.2 g/dL (ref 1.5–4.5)
Glucose: 103 mg/dL — ABNORMAL HIGH (ref 65–99)
POTASSIUM: 4.3 mmol/L (ref 3.5–5.2)
SODIUM: 141 mmol/L (ref 134–144)
Total Protein: 6.5 g/dL (ref 6.0–8.5)

## 2018-02-01 LAB — LIPID PANEL
CHOLESTEROL TOTAL: 188 mg/dL (ref 100–199)
Chol/HDL Ratio: 3.1 ratio (ref 0.0–5.0)
HDL: 61 mg/dL (ref >39)
LDL CALC: 90 mg/dL (ref 0–99)
TRIGLYCERIDES: 187 mg/dL — AB (ref 0–149)
VLDL Cholesterol Cal: 37 mg/dL (ref 5–40)

## 2018-02-01 MED ORDER — ROSUVASTATIN CALCIUM 20 MG PO TABS: 20.0000 mg | ORAL_TABLET | Freq: Every day | ORAL | 3 refills | Status: DC

## 2018-02-01 MED ORDER — METOPROLOL SUCCINATE ER 50 MG PO TB24: 50.0000 mg | ORAL_TABLET | Freq: Every day | ORAL | 3 refills | Status: DC

## 2018-02-01 MED ORDER — ISOSORBIDE MONONITRATE ER 60 MG PO TB24: 60.0000 mg | ORAL_TABLET | Freq: Every day | ORAL | 3 refills | Status: DC

## 2018-02-01 MED ORDER — NITROGLYCERIN 0.4 MG SL SUBL: 0.4000 mg | SUBLINGUAL_TABLET | SUBLINGUAL | 3 refills | Status: DC | PRN

## 2018-02-01 NOTE — Progress Notes (Signed)
Cardiology Office Note:    Date:  02/01/2018   ID:  John Cha, MD, DOB Mar 29, 1945, MRN [id number]  PCP:  Orma Flaming, MD  Cardiologist:  Sherren Mocha, MD  Electrophysiologist:  None   Referring MD: Orma Flaming, MD   Chief Complaint  Patient presents with  . 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  He initially presented in 2013 with exertional angina and a nuclear stress test was performed demonstrating high risk findings.  Cardiac catheterization showed severe diagonal branch stenosis and he underwent balloon angioplasty of the diagonal.  He is been managed medically since that time.  His last stress test in 2017 demonstrated low risk findings with no major areas of ischemia identified.  The patient is here alone today.  He is been doing well.  He continues to do a lot of international traveling to fairly remote places.  He has occasional chest discomfort but this is very rare.  He exercises on a regular basis with no exertional symptoms.  He denies exertional chest pain, shortness of breath, or palpitations.  He has some dizziness but no postural symptoms.  He denies edema, orthopnea, or PND.  Past Medical History:  Diagnosis Date  . 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 CAD (coronary artery disease)    a. 12/11/2011 Ex MV: Med/Sev ant & antlat ischemia, EF 53%, 24mm ST dep V4-V6;  b. 12/11/2011 Cath/PTCA: LM nl, LAD 20-45m, D1 99ost (Tx w/ PTCA only), LCX 40/5m, RCA diff nonobs dzs, EF 55%, sublte dist antlat HK.    Marland Kitchen History of nephrolithiasis   . History of smallpox childhood  . HTN (hypertension)   . Hyperlipidemia   . Kidney stone ~ 2003  . Shingles rash 2011   right scalp    Past Surgical History:  Procedure Laterality Date  . CORONARY ANGIOPLASTY  12/11/2011  . LEFT HEART CATHETERIZATION WITH CORONARY  ANGIOGRAM N/A 12/11/2011   Procedure: LEFT HEART CATHETERIZATION WITH CORONARY ANGIOGRAM;  Surgeon: Sherren Mocha, MD;  Location: [facility] CATH LAB;  Service: Cardiovascular;  Laterality: N/A;  . TONSILLECTOMY AND ADENOIDECTOMY  1972  . VASECTOMY  2009    Current Medications: Current Meds  Medication Sig  . aspirin 81 MG chewable tablet Chew 1 tablet (81 mg total) by mouth daily.  . isosorbide mononitrate (IMDUR) 60 MG 24 hr tablet Take 1 tablet (60 mg total) by mouth daily.  . metoprolol succinate (TOPROL-XL) 50 MG 24 hr tablet Take 1 tablet (50 mg total) by mouth daily.  . nitroGLYCERIN (NITROSTAT) 0.4 MG SL tablet Place 1 tablet (0.4 mg total) under the tongue every 5 (five) minutes as needed for chest pain.  . rosuvastatin (CRESTOR) 20 MG tablet Take 1 tablet (20 mg total) by mouth daily.  . [DISCONTINUED] isosorbide mononitrate (IMDUR) 60 MG 24 hr tablet Take 1 tablet (60 mg total) by mouth daily. Please keep upcoming appt with Dr. Burt Knack in December before anymore refills. Thank you  . [DISCONTINUED] metoprolol succinate (TOPROL-XL) 50 MG 24 hr tablet Take 1 tablet (50 mg total) by mouth daily. Please keep upcoming appt in December with Dr. Burt Knack before anymore refills. Thank you  . [DISCONTINUED] nitroGLYCERIN (NITROSTAT) 0.4 MG SL tablet Place 1 tablet (0.4 mg total) under the tongue every 5 (five) minutes as needed for chest pain.  . [DISCONTINUED]  rosuvastatin (CRESTOR) 20 MG tablet Take 1 tablet (20 mg total) by mouth daily. Please keep upcoming appt in December with Dr. Burt Knack before anymore refills. Thank you     Allergies:   Patient has no known allergies.   Social History   Socioeconomic History  . Marital status: Married    Spouse name: Not on file  . Number of children: 4  . Years of education: 67  . Highest education level: Not on file  Occupational History  . Occupation: neurosurgeon    Comment: [employer]  Social Needs  . Financial resource strain: Not on file  . Food  insecurity:    Worry: Not on file    Inability: Not on file  . Transportation needs:    Medical: Not on file    Non-medical: Not on file  Tobacco Use  . Smoking status: Former Smoker    Packs/day: 0.50    Years: 8.00    Pack years: 4.00    Types: Cigarettes    Last attempt to quit: 03/04/1987    Years since quitting: 30.9  . Smokeless tobacco: Never Used  . Tobacco comment: 12/11/2011 "stopped smoking cigarettes ~ 25 yr ago"  Substance and Sexual Activity  . Alcohol use: Yes    Alcohol/week: 15.0 standard drinks    Types: 15 Glasses of wine per week  . Drug use: No  . Sexual activity: Yes    Partners: Female  Lifestyle  . Physical activity:    Days per week: Not on file    Minutes per session: Not on file  . Stress: Not on file  Relationships  . Social connections:    Talks on phone: Not on file    Gets together: Not on file    Attends religious service: Not on file    Active member of club or organization: Not on file    Attends meetings of clubs or organizations: Not on file    Relationship status: Not on file  Other Topics Concern  . Not on file  Social History [place] - '72 - [place], [place]; surgical resident [place]; [place]. [place] '84. Married 10 years - divorced. Married '90 - ; 2 sons - ;'23, '02; 2 dtrs - '91, '96. Work - Engineer, technical sales. Marriage in good health. I son - ice skater/competitive, 1 son Chef in the [place] area.     Family History: The patient's family history includes AAA (abdominal aortic aneurysm) in his father. There is no history of Diabetes, Hypertension, or Cancer.  ROS:   Please see the history of present illness.    Positive for dizziness.  All other systems reviewed and are negative.  EKGs/Labs/Other Studies Reviewed:    The following studies were reviewed today: Myocardial perfusion study 10/03/2015: Study Highlights     Nuclear stress EF: 49%.  Blood pressure demonstrated a hypertensive response to  exercise.  There was no ST segment deviation noted during stress.  This is a low risk study.  The left ventricular ejection fraction is mildly decreased (45-54%).   Normal resting and stress perfusion. No ischemia or infarction EF 49% but visually looks normal. HTN response to exercise    EKG:  EKG is ordered today.  The ekg ordered today demonstrates normal sinus rhythm 64 bpm, incomplete right bundle branch block, no significant change from last tracing.  Minimal voltage criteria for LVH may be normal variant.  Recent Labs: 07/22/2017: ALT 17; BUN 24; Creatinine, Ser 0.84; Hemoglobin 15.2; Platelets  206.0; Potassium 5.2; Sodium 142  Recent Lipid Panel    Component Value Date/Time   CHOL 179 07/22/2017 1139   CHOL 183 11/18/2016 1042   TRIG 66.0 07/22/2017 1139   HDL 56.70 07/22/2017 1139   HDL 55 11/18/2016 1042   CHOLHDL 3 07/22/2017 1139   VLDL 13.2 07/22/2017 1139   LDLCALC 109 (H) 07/22/2017 1139   LDLCALC 93 11/18/2016 1042   LDLDIRECT 156.5 12/01/2011 0958    Physical Exam:    VS:  BP 136/82   Pulse 64   Ht 5\' 8"  (1.727 m)   Wt 180 lb (81.6 kg)   SpO2 98%   BMI 27.37 kg/m     Wt Readings from Last 3 Encounters:  02/01/18 180 lb (81.6 kg)  07/22/17 171 lb 12.8 oz (77.9 kg)  11/14/16 180 lb (81.6 kg)     GEN:  Well nourished, well developed in no acute distress HEENT: Normal NECK: No JVD; No carotid bruits LYMPHATICS: No lymphadenopathy CARDIAC: RRR, no murmurs, rubs, gallops RESPIRATORY:  Clear to auscultation without rales, wheezing or rhonchi  ABDOMEN: Soft, non-tender, non-distended MUSCULOSKELETAL:  No edema; No deformity  SKIN: Warm and dry NEUROLOGIC:  Alert and oriented x 3 PSYCHIATRIC:  Normal affect   ASSESSMENT:    1. Essential hypertension   2. Coronary artery disease involving native coronary artery of native heart with angina pectoris ([place])   3. Family history of abdominal aortic aneurysm (AAA)   4. Mixed hyperlipidemia    PLAN:     In order of problems listed above:  1. BP well-controlled on metoprolol and isosorbide 2. Stable symptoms. Continue ASA, statin, beta-blocker, isosorbide.  3. Exam ok, aorta does not feel enlarged to palpation. Check aortic US. 4. Lipids reviewed. Most recent LDL above goal. Treated with crestor 20 mg daily. Repeat labs.    Medication Adjustments/Labs and Tests Ordered: Current medicines are reviewed at length with the patient today.  Concerns regarding medicines are outlined above.  Orders Placed This Encounter  Procedures  . Comprehensive metabolic panel  . Lipid panel  . EKG 12-Lead   Meds ordered this encounter  Medications  . nitroGLYCERIN (NITROSTAT) 0.4 MG SL tablet    Sig: Place 1 tablet (0.4 mg total) under the tongue every 5 (five) minutes as needed for chest pain.    Dispense:  25 tablet    Refill:  3  . isosorbide mononitrate (IMDUR) 60 MG 24 hr tablet    Sig: Take 1 tablet (60 mg total) by mouth daily.    Dispense:  90 tablet    Refill:  3  . metoprolol succinate (TOPROL-XL) 50 MG 24 hr tablet    Sig: Take 1 tablet (50 mg total) by mouth daily.    Dispense:  90 tablet    Refill:  3  . rosuvastatin (CRESTOR) 20 MG tablet    Sig: Take 1 tablet (20 mg total) by mouth daily.    Dispense:  90 tablet    Refill:  3    Patient Instructions  Medication Instructions:  Your provider recommends that you continue on your current medications as directed. Please refer to the Current Medication list given to you today.    Labwork: TODAY: CMET, lipids  Testing/Procedures: Dr. Burt Knack recommends you have a AAA DUPLEX.  Follow-Up: Your provider wants you to follow-up in: 1 year with Dr. Burt Knack. You will receive a reminder letter in the mail two months in advance. If you don't receive a letter, please call our  office to schedule the follow-up appointment.       Signed, Sherren Mocha, MD  02/01/2018 1:23 PM    [facility]

## 2018-02-01 NOTE — Patient Instructions (Signed)
Medication Instructions:  Your provider recommends that you continue on your current medications as directed. Please refer to the Current Medication list given to you today.    Labwork: TODAY: CMET, lipids  Testing/Procedures: Dr. Burt Knack recommends you have a AAA DUPLEX.  Follow-Up: Your provider wants you to follow-up in: 1 year with Dr. Burt Knack. You will receive a reminder letter in the mail two months in advance. If you don't receive a letter, please call our office to schedule the follow-up appointment.

## 2018-02-04 ENCOUNTER — Other Ambulatory Visit: Payer: Self-pay | Admitting: Cardiovascular Disease

## 2018-02-04 DIAGNOSIS — Z8249 Family history of ischemic heart disease and other diseases of the circulatory system: Secondary | ICD-10-CM

## 2018-02-04 DIAGNOSIS — I25119 Atherosclerotic heart disease of native coronary artery with unspecified angina pectoris: Secondary | ICD-10-CM

## 2018-02-16 ENCOUNTER — Ambulatory Visit (HOSPITAL_COMMUNITY)
Admission: RE | Admit: 2018-02-16 | Discharge: 2018-02-16 | Disposition: A | Discharge status: Home / Self Care | Payer: Medicare Other | Source: Ambulatory Visit | Attending: Cardiology | Admitting: Cardiology

## 2018-02-16 DIAGNOSIS — Z8489 Family history of other specified conditions: Secondary | ICD-10-CM

## 2018-02-16 DIAGNOSIS — E782 Mixed hyperlipidemia: Secondary | ICD-10-CM | POA: Insufficient documentation

## 2018-02-16 DIAGNOSIS — Z79899 Other long term (current) drug therapy: Secondary | ICD-10-CM | POA: Diagnosis not present

## 2018-02-16 DIAGNOSIS — Z136 Encounter for screening for cardiovascular disorders: Secondary | ICD-10-CM | POA: Diagnosis not present

## 2018-02-16 DIAGNOSIS — I25119 Atherosclerotic heart disease of native coronary artery with unspecified angina pectoris: Secondary | ICD-10-CM | POA: Diagnosis not present

## 2018-02-16 DIAGNOSIS — Z7982 Long term (current) use of aspirin: Secondary | ICD-10-CM | POA: Diagnosis not present

## 2018-02-16 DIAGNOSIS — Z8249 Family history of ischemic heart disease and other diseases of the circulatory system: Secondary | ICD-10-CM | POA: Diagnosis not present

## 2018-02-16 DIAGNOSIS — I10 Essential (primary) hypertension: Secondary | ICD-10-CM | POA: Diagnosis not present

## 2018-02-16 DIAGNOSIS — Z87891 Personal history of nicotine dependence: Secondary | ICD-10-CM

## 2018-02-18 ENCOUNTER — Encounter: Payer: Self-pay | Admitting: Family Medicine

## 2018-02-19 ENCOUNTER — Other Ambulatory Visit (INDEPENDENT_AMBULATORY_CARE_PROVIDER_SITE_OTHER): Payer: Medicare Other

## 2018-02-19 ENCOUNTER — Other Ambulatory Visit: Payer: Self-pay | Admitting: Family Medicine

## 2018-02-19 DIAGNOSIS — R7303 Prediabetes: Secondary | ICD-10-CM | POA: Diagnosis not present

## 2018-02-19 LAB — HEMOGLOBIN A1C: HEMOGLOBIN A1C: 5.6 % (ref 4.6–6.5)

## 2018-11-09 ENCOUNTER — Other Ambulatory Visit: Payer: Self-pay | Admitting: Cardiovascular Disease

## 2018-12-02 ENCOUNTER — Other Ambulatory Visit: Payer: Self-pay

## 2018-12-02 DIAGNOSIS — Z20822 Contact with and (suspected) exposure to covid-19: Secondary | ICD-10-CM

## 2018-12-03 LAB — NOVEL CORONAVIRUS, NAA: SARS-CoV-2, NAA: NOT DETECTED

## 2019-01-24 ENCOUNTER — Telehealth: Payer: Self-pay | Admitting: Family Medicine

## 2019-01-24 NOTE — Telephone Encounter (Signed)
John Stein received a call from [facility] [employer] [facility] about scheduling AWV.  I returned her call and left a message asking her to schedule AWV-Initial w/ Loma Sousa [facility]. Pt is also due for CPE.
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
## 2019-02-01 ENCOUNTER — Encounter: Payer: Self-pay | Admitting: Physician Assistant

## 2019-02-01 ENCOUNTER — Other Ambulatory Visit: Payer: Self-pay

## 2019-02-01 ENCOUNTER — Ambulatory Visit: Payer: Medicare Other | Admitting: Physician Assistant

## 2019-02-01 ENCOUNTER — Encounter: Payer: Self-pay | Admitting: Gastroenterology

## 2019-02-01 VITALS — BP 126/70 | HR 58 | Ht 68.0 in | Wt 183.4 lb

## 2019-02-01 DIAGNOSIS — I25119 Atherosclerotic heart disease of native coronary artery with unspecified angina pectoris: Secondary | ICD-10-CM

## 2019-02-01 DIAGNOSIS — I10 Essential (primary) hypertension: Secondary | ICD-10-CM | POA: Diagnosis not present

## 2019-02-01 DIAGNOSIS — R1013 Epigastric pain: Secondary | ICD-10-CM | POA: Diagnosis not present

## 2019-02-01 DIAGNOSIS — E782 Mixed hyperlipidemia: Secondary | ICD-10-CM

## 2019-02-01 LAB — COMPREHENSIVE METABOLIC PANEL
ALT: 15 IU/L (ref 0–44)
AST: 15 IU/L (ref 0–40)
Albumin/Globulin Ratio: 1.9 (ref 1.2–2.2)
Albumin: 4 g/dL (ref 3.7–4.7)
Alkaline Phosphatase: 68 IU/L (ref 39–117)
BUN/Creatinine Ratio: 19 (ref 10–24)
BUN: 15 mg/dL (ref 8–27)
Bilirubin Total: 0.8 mg/dL (ref 0.0–1.2)
CO2: 26 mmol/L (ref 20–29)
Calcium: 9 mg/dL (ref 8.6–10.2)
Chloride: 102 mmol/L (ref 96–106)
Creatinine, Ser: 0.81 mg/dL (ref 0.76–1.27)
GFR calc Af Amer: 103 mL/min/{1.73_m2} (ref >59)
GFR calc non Af Amer: 89 mL/min/{1.73_m2} (ref >59)
Globulin, Total: 2.1 g/dL (ref 1.5–4.5)
Glucose: 103 mg/dL — ABNORMAL HIGH (ref 65–99)
Potassium: 4.3 mmol/L (ref 3.5–5.2)
Sodium: 137 mmol/L (ref 134–144)
Total Protein: 6.1 g/dL (ref 6.0–8.5)

## 2019-02-01 LAB — CBC
Hematocrit: 38.7 % (ref 37.5–51.0)
Hemoglobin: 13.3 g/dL (ref 13.0–17.7)
MCH: 31.5 pg (ref 26.6–33.0)
MCHC: 34.4 g/dL (ref 31.5–35.7)
MCV: 92 fL (ref 79–97)
Platelets: 186 10*3/uL (ref 150–450)
RBC: 4.22 x10E6/uL (ref 4.14–5.80)
RDW: 13 % (ref 11.6–15.4)
WBC: 5.5 10*3/uL (ref 3.4–10.8)

## 2019-02-01 LAB — LIPID PANEL
Chol/HDL Ratio: 3.1 ratio (ref 0.0–5.0)
Cholesterol, Total: 181 mg/dL (ref 100–199)
HDL: 59 mg/dL (ref >39)
LDL Chol Calc (NIH): 103 mg/dL — ABNORMAL HIGH (ref 0–99)
Triglycerides: 105 mg/dL (ref 0–149)
VLDL Cholesterol Cal: 19 mg/dL (ref 5–40)

## 2019-02-01 MED ORDER — NITROGLYCERIN 0.4 MG SL SUBL: SUBLINGUAL_TABLET | SUBLINGUAL | 3 refills | Status: DC

## 2019-02-01 MED ORDER — OMEPRAZOLE 40 MG PO CPDR: 40.0000 mg | DELAYED_RELEASE_CAPSULE | Freq: Every day | ORAL | 2 refills | Status: DC

## 2019-02-01 MED ORDER — ISOSORBIDE MONONITRATE ER 60 MG PO TB24: 60.0000 mg | ORAL_TABLET | Freq: Every day | ORAL | 3 refills | Status: DC

## 2019-02-01 MED ORDER — ROSUVASTATIN CALCIUM 20 MG PO TABS: 20.0000 mg | ORAL_TABLET | Freq: Every day | ORAL | 3 refills | Status: DC

## 2019-02-01 MED ORDER — METOPROLOL SUCCINATE ER 50 MG PO TB24: 50.0000 mg | ORAL_TABLET | Freq: Every day | ORAL | 3 refills | Status: DC

## 2019-02-01 NOTE — Addendum Note (Signed)
Addended by: Mendel Ryder on: 02/01/2019 10:16 AM   Modules accepted: Orders

## 2019-02-01 NOTE — Patient Instructions (Addendum)
Medication Instructions:  START: Prilosec  40 mg daily   *If you need a refill on your cardiac medications before your next appointment, please call your pharmacy*  Lab Work: TODAY: CMET, LIPIDS & CBC   If you have labs (blood work) drawn today and your tests are completely normal, you will receive your results only by: Marland Kitchen MyChart Message (if you have MyChart) OR . A paper copy in the mail If you have any lab test that is abnormal or we need to change your treatment, we will call you to review the results.  Testing/Procedures: You have been referred to gastroenterology for abdominal pain   Follow-Up: At [facility], you and your health needs are our priority.  As part of our continuing mission to provide you with exceptional heart care, we have created designated Provider Care Teams.  These Care Teams include your primary Cardiologist (physician) and Advanced Practice Providers (APPs -  Physician Assistants and Nurse Practitioners) who all work together to provide you with the care you need, when you need it.  Your next appointment:   12 month(s)  The format for your next appointment:   Either In Person or Virtual  Provider:   You may see Sherren Mocha, MD or one of the following Advanced Practice Providers on your designated Care Team:    Richardson Dopp, PA-C  [place], [place]  Daune Perch, NP   Other Instructions

## 2019-02-02 ENCOUNTER — Other Ambulatory Visit: Payer: Self-pay | Admitting: *Deleted

## 2019-02-02 DIAGNOSIS — E782 Mixed hyperlipidemia: Secondary | ICD-10-CM

## 2019-02-02 MED ORDER — ROSUVASTATIN CALCIUM 40 MG PO TABS: 40.0000 mg | ORAL_TABLET | Freq: Every day | ORAL | 3 refills | Status: DC

## 2019-02-03 ENCOUNTER — Other Ambulatory Visit: Payer: Self-pay | Admitting: Physician Assistant

## 2019-02-03 MED ORDER — NITROGLYCERIN 0.4 MG SL SUBL: 0.4000 mg | SUBLINGUAL_TABLET | SUBLINGUAL | 6 refills | Status: DC | PRN

## 2019-03-07 ENCOUNTER — Encounter: Payer: Self-pay | Admitting: Gastroenterology

## 2019-03-07 ENCOUNTER — Ambulatory Visit: Payer: Medicare Other | Admitting: Gastroenterology

## 2019-03-07 VITALS — BP 122/76 | HR 80 | Temp 98.8°F | Ht 68.0 in | Wt 182.0 lb

## 2019-03-07 DIAGNOSIS — R1012 Left upper quadrant pain: Secondary | ICD-10-CM

## 2019-03-07 DIAGNOSIS — Z1159 Encounter for screening for other viral diseases: Secondary | ICD-10-CM | POA: Diagnosis not present

## 2019-03-07 DIAGNOSIS — R142 Eructation: Secondary | ICD-10-CM

## 2019-03-07 NOTE — Progress Notes (Signed)
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  Deep pain in LUQ along with belching. Started approx 12 months ago, now everyday. Does not limit him from daily activities.  Symptoms occur somewhat predictably around 11:00-12:00, lasting 3-4 hours.  Not related to p.o. intake.  No wt loss, night sweats, fever, chills, nausea, vomiting, change in bowel habits. No improvement with OTC Zantac. Hx of reflux 8-10 years ago, which was self limiting and no ongoing need for medications.   History of angina, unchanged from previous, and symptoms not similar to the above complaint.  Recently evaluated in the Cardiology clinic.  Labs in 02/2019 with normal CBC, and CMP.  No previous EGD.  No recent abdominal imaging for review today.   Endoscopic history: -Colonoscopy (09/2014, Dr. Earlean Shawl): Internal hemorrhoids, otherwise normal.  Repeat in 10 years -Colonoscopy (01/2004): Normal per report  Family history notable for: -Maternal aunt x2 and cousin with pancreatic cancer.  No first-degree relatives with pancreatic cancer.  Past Medical History:  Diagnosis Date  . 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 CAD (coronary artery disease)    a. 12/11/2011 Ex MV: Med/Sev ant & antlat ischemia, EF 53%, 15mm ST dep V4-V6;  b. 12/11/2011 Cath/PTCA: LM nl, LAD 20-61m, D1 99ost (Tx w/ PTCA only), LCX 40/67m, RCA diff nonobs dzs, EF 55%, sublte dist antlat HK.    Marland Kitchen History of nephrolithiasis   . History of smallpox childhood  . HTN (hypertension)   . Hyperlipidemia   . Kidney stone ~ 2003  . Shingles rash 2011   right scalp     Past Surgical History:  Procedure Laterality Date  .  CORONARY ANGIOPLASTY  12/11/2011  . LEFT HEART CATHETERIZATION WITH CORONARY ANGIOGRAM N/A 12/11/2011   Procedure: LEFT HEART CATHETERIZATION WITH CORONARY ANGIOGRAM;  Surgeon: Sherren Mocha, MD;  Location: [facility] CATH LAB;  Service: Cardiovascular;  Laterality: N/A;  . TONSILLECTOMY AND ADENOIDECTOMY  1972  . VASECTOMY  2009   Family History  Problem Relation Age of Onset  . AAA (abdominal aortic aneurysm) Father   . Pancreatic cancer Cousin   . Pancreatic cancer Maternal Aunt        x 2 aunts   . Diabetes Neg Hx   . Hypertension Neg Hx   . Colon cancer Neg Hx   . Stomach cancer Neg Hx   . Throat cancer Neg Hx    Social History   Tobacco Use  . Smoking status: Former Smoker    Packs/day: 0.50    Years: 8.00    Pack years: 4.00    Types: Cigarettes    Quit date: 03/04/1987    Years since quitting: 32.0  . Smokeless tobacco: Never Used  . Tobacco comment: 12/11/2011 "stopped smoking cigarettes ~ 25 yr ago"  Substance Use Topics  . Alcohol use: Yes    Alcohol/week: 15.0 standard drinks    Types: 15 Glasses of wine per week    Comment: wine with dinner 2-3   . Drug use: No   Current Outpatient Medications  Medication Sig Dispense Refill  . aspirin 81 MG chewable tablet Chew 1 tablet (81  mg total) by mouth daily.    . isosorbide mononitrate (IMDUR) 60 MG 24 hr tablet Take 1 tablet (60 mg total) by mouth daily. 90 tablet 3  . metoprolol succinate (TOPROL-XL) 50 MG 24 hr tablet Take 1 tablet (50 mg total) by mouth daily. 90 tablet 3  . nitroGLYCERIN (NITROSTAT) 0.4 MG SL tablet Place 1 tablet (0.4 mg total) under the tongue every 5 (five) minutes as needed for chest pain. 25 tablet 6  . rosuvastatin (CRESTOR) 40 MG tablet Take 1 tablet (40 mg total) by mouth daily. 90 tablet 3   No current facility-administered medications for this visit.   No Known Allergies   Review of Systems: All systems reviewed and negative except where noted in HPI.     Physical Exam:    Wt  Readings from Last 3 Encounters:  03/07/19 182 lb (82.6 kg)  02/01/19 183 lb 6.4 oz (83.2 kg)  02/01/18 180 lb (81.6 kg)    BP 122/76   Pulse 80   Temp 98.8 F (37.1 C)   Ht 5\' 8"  (1.727 m)   Wt 182 lb (82.6 kg)   BMI 27.67 kg/m  Constitutional:  Pleasant, in no acute distress. Psychiatric: Normal mood and affect. Behavior is normal. EENT: Pupils normal.  Conjunctivae are normal. No scleral icterus. Neck supple. No cervical LAD. Cardiovascular: Normal rate, regular rhythm. No edema Pulmonary/chest: Effort normal and breath sounds normal. No wheezing, rales or rhonchi. Abdominal: Localized TTP in LUQ.  Negative Carnett's sign.  Otherwise soft, nondistended.  No peritoneal signs.  Bowel sounds active throughout. There are no masses palpable. No hepatomegaly. Neurological: Alert and oriented to person place and time. Skin: Skin is warm and dry. No rashes noted.   ASSESSMENT AND PLAN;   1) LUQ Pain 2) Belching -Discussed the DDx for presenting symptoms, and plan for expedited endoscopic evaluation this week. -EGD to evaluate for PUD, gastritis, reflux changes with random and directed gastric biopsies - Will hold off on trial of Rx pending endoscopic findings  The indications, risks, and benefits of EGD were explained to the patient in detail. Risks include but are not limited to bleeding, perforation, adverse reaction to medications, and cardiopulmonary compromise. Sequelae include but are not limited to the possibility of surgery, hositalization, and mortality. The patient verbalized understanding and wished to proceed. All questions answered, referred to scheduler. Further recommendations pending results of the exam.   Lavena Bullion, DO, FACG  03/07/2019, 2:24 PM   Orma Flaming, MD

## 2019-03-07 NOTE — Addendum Note (Signed)
Addended by: Lavena Bullion on: 03/07/2019 04:55 PM   Modules accepted: Level of Service

## 2019-03-07 NOTE — Patient Instructions (Signed)
You have been scheduled for an endoscopy. Please follow written instructions given to you at your visit today. If you use inhalers (even only as needed), please bring them with you on the day of your procedure.   

## 2019-03-08 ENCOUNTER — Ambulatory Visit (INDEPENDENT_AMBULATORY_CARE_PROVIDER_SITE_OTHER): Payer: Medicare Other

## 2019-03-08 DIAGNOSIS — Z1159 Encounter for screening for other viral diseases: Secondary | ICD-10-CM | POA: Diagnosis not present

## 2019-03-09 LAB — SARS CORONAVIRUS 2 (TAT 6-24 HRS): SARS Coronavirus 2: NEGATIVE

## 2019-03-10 ENCOUNTER — Encounter: Payer: Self-pay | Admitting: Gastroenterology

## 2019-03-10 ENCOUNTER — Ambulatory Visit (AMBULATORY_SURGERY_CENTER): Payer: Medicare Other | Admitting: Gastroenterology

## 2019-03-10 ENCOUNTER — Other Ambulatory Visit: Payer: Self-pay

## 2019-03-10 VITALS — BP 115/72 | HR 58 | Temp 97.8°F | Resp 18 | Ht 68.0 in | Wt 182.0 lb

## 2019-03-10 DIAGNOSIS — I2510 Atherosclerotic heart disease of native coronary artery without angina pectoris: Secondary | ICD-10-CM | POA: Diagnosis not present

## 2019-03-10 DIAGNOSIS — K2970 Gastritis, unspecified, without bleeding: Secondary | ICD-10-CM

## 2019-03-10 DIAGNOSIS — R142 Eructation: Secondary | ICD-10-CM

## 2019-03-10 DIAGNOSIS — R1012 Left upper quadrant pain: Secondary | ICD-10-CM

## 2019-03-10 DIAGNOSIS — I10 Essential (primary) hypertension: Secondary | ICD-10-CM | POA: Diagnosis not present

## 2019-03-10 DIAGNOSIS — K2950 Unspecified chronic gastritis without bleeding: Secondary | ICD-10-CM | POA: Diagnosis not present

## 2019-03-10 DIAGNOSIS — B9681 Helicobacter pylori [H. pylori] as the cause of diseases classified elsewhere: Secondary | ICD-10-CM | POA: Diagnosis not present

## 2019-03-10 MED ORDER — PANTOPRAZOLE SODIUM 40 MG PO TBEC: DELAYED_RELEASE_TABLET | ORAL | 3 refills | Status: DC

## 2019-03-10 MED ORDER — SODIUM CHLORIDE 0.9 % IV SOLN: 500.0000 mL | Freq: Once | INTRAVENOUS | Status: DC

## 2019-03-10 NOTE — Patient Instructions (Signed)
RX FOR PROTONIX SENT TO [facility] R [facility]ARM[facility]  HANDOUT  ON GASTRITIS GIVEN TO YOU TODAY  AWAIT BIOPSY RESULTS AND CLOTEST BIOPSY RESULTS  RESUME OTHER PREVIOUS MEDICATIONS AND DIET     YOU HAD AN ENDOSCOPIC PROCEDURE TODAY AT [facility]:   Refer to the procedure report that was given to you for any specific questions about what was found during the examination.  If the procedure report does not answer your questions, please call your gastroenterologist to clarify.  If you requested that your care partner not be given the details of your procedure findings, then the procedure report has been included in a sealed envelope for you to review at your convenience later.  YOU SHOULD EXPECT: Some feelings of bloating in the abdomen. Passage of more gas than usual.  Walking can help get rid of the air that was put into your GI tract during the procedure and reduce the bloating. If you had a lower endoscopy (such as a colonoscopy or flexible sigmoidoscopy) you may notice spotting of blood in your stool or on the toilet paper. If you underwent a bowel prep for your procedure, you may not have a normal bowel movement for a few days.  Please Note:  You might notice some irritation and congestion in your nose or some drainage.  This is from the oxygen used during your procedure.  There is no need for concern and it should clear up in a day or so.  SYMPTOMS TO REPORT IMMEDIATELY:    Following upper endoscopy (EGD)  Vomiting of blood or coffee ground material  New chest pain or pain under the shoulder blades  Painful or persistently difficult swallowing  New shortness of breath  Fever of 100F or higher  Black, tarry-looking stools  For urgent or emergent issues, a gastroenterologist can be reached at any hour by calling [phone].   DIET:  We do recommend a small meal at first, but then you may proceed to your regular diet.  Drink plenty of fluids but you should avoid alcoholic  beverages for 24 hours.  ACTIVITY:  You should plan to take it easy for the rest of today and you should NOT DRIVE or use heavy machinery until tomorrow (because of the sedation medicines used during the test).    FOLLOW UP: Our staff will call the number listed on your records 48-72 hours following your procedure to check on you and address any questions or concerns that you may have regarding the information given to you following your procedure. If we do not reach you, we will leave a message.  We will attempt to reach you two times.  During this call, we will ask if you have developed any symptoms of COVID 19. If you develop any symptoms (ie: fever, flu-like symptoms, shortness of breath, cough etc.) before then, please call [phone].  If you test positive for Covid 19 in the 2 weeks post procedure, please call and report this information to [place].    If any biopsies were taken you will be contacted by phone or by letter within the next 1-3 weeks.  Please call us at [phone] if you have not heard about the biopsies in 3 weeks.    SIGNATURES/CONFIDENTIALITY: You and/or your care partner have signed paperwork which will be entered into your electronic medical record.  These signatures attest to the fact that that the information above on your After Visit Summary has been reviewed and is understood.  Full responsibility of the confidentiality of this discharge information lies with you and/or your care-partner.

## 2019-03-10 NOTE — Progress Notes (Signed)
Called to room to assist during endoscopic procedure.  Patient ID and intended procedure confirmed with present staff. Received instructions for my participation in the procedure from the performing physician.  

## 2019-03-10 NOTE — Op Note (Signed)
[facility] Patient Name: John Stein Procedure Date: 03/10/2019 10:14 AM MRN: [id number] Endoscopist: Gerrit Heck , MD Age: 74 Referring MD:  Date of Birth: 03-02-1946 Gender: Male Account #: [id number] Procedure:                Upper GI endoscopy Indications:              Abdominal pain in the left upper quadrant,                            Eructation                           74 yo male with LUQ pain and belching for the last                            12 months, with increasing frequency to now daily                            occurance, typically occuring around 11:00-12:00                            daily. Does not eat breakfast daily, but sxs seem                            to occur after eating early lunch. Medicines:                Monitored Anesthesia Care Procedure:                Pre-Anesthesia Assessment:                           - Prior to the procedure, a History and Physical                            was performed, and patient medications and                            allergies were reviewed. The patient's tolerance of                            previous anesthesia was also reviewed. The risks                            and benefits of the procedure and the sedation                            options and risks were discussed with the patient.                            All questions were answered, and informed consent                            was obtained. Prior Anticoagulants: The patient has  taken no previous anticoagulant or antiplatelet                            agents. ASA Grade Assessment: II - A patient with                            mild systemic disease. After reviewing the risks                            and benefits, the patient was deemed in                            satisfactory condition to undergo the procedure.                           After obtaining informed consent, the endoscope was                passed under direct vision. Throughout the                            procedure, the patient's blood pressure, pulse, and                            oxygen saturations were monitored continuously. The                            Endoscope was introduced through the mouth, and                            advanced to the second part of duodenum. The upper                            GI endoscopy was accomplished without difficulty.                            The patient tolerated the procedure well. Scope In: Scope Out: Findings:                 The examined esophagus was normal.                           The Z-line was regular and was found 43 cm from the                            incisors.                           Diffuse moderate inflammation characterized by                            congestion (edema) and erythema was found in the                            gastric fundus and in the gastric body, with mild  inflammation scattered throughout the gastric                            antrum. Several mucosal biopsies were taken with a                            cold forceps for Helicobacter pylori testing.                            Estimated blood loss was minimal.                           The pylorus was normal and widely patent.                           The duodenal bulb, first portion of the duodenum                            and second portion of the duodenum were normal. Complications:            No immediate complications. Estimated Blood Loss:     Estimated blood loss was minimal. Impression:               - Normal esophagus.                           - Z-line regular, 43 cm from the incisors.                           - Gastritis. Biopsied.                           - Normal pylorus.                           - Normal duodenal bulb, first portion of the                            duodenum and second portion of the duodenum. Recommendation:            - Patient has a contact number available for                            emergencies. The signs and symptoms of potential                            delayed complications were discussed with the                            patient. Return to normal activities tomorrow.                            Written discharge instructions were provided to the                            patient.                           -  Resume previous diet.                           - Continue present medications.                           - Await pathology results.                           - Use Protonix (pantoprazole) 40 mg PO BID for 8                            weeks to promote mucosal healing of the noted                            gastritis. Additional medical management pending                            biopsy results as well as response to therapy. Gerrit Heck, MD 03/10/2019 10:38:32 AM

## 2019-03-14 ENCOUNTER — Telehealth: Payer: Self-pay | Admitting: *Deleted

## 2019-03-14 NOTE — Telephone Encounter (Signed)
Left message on follow up call. 

## 2019-03-14 NOTE — Telephone Encounter (Signed)
  Follow up Call-  Call back number 03/10/2019  Post procedure Call Back phone  # [phone]  or [phone]  Permission to leave phone message Yes  Some recent data might be hidden     Patient questions:  Call could not be completed at this time.  Second call.

## 2019-03-15 ENCOUNTER — Other Ambulatory Visit: Payer: Self-pay | Admitting: Gastroenterology

## 2019-03-15 DIAGNOSIS — B9681 Helicobacter pylori [H. pylori] as the cause of diseases classified elsewhere: Secondary | ICD-10-CM

## 2019-03-15 DIAGNOSIS — K2970 Gastritis, unspecified, without bleeding: Secondary | ICD-10-CM

## 2019-03-15 MED ORDER — BISMUTH SUBSALICYLATE 262 MG PO CHEW: 524.0000 mg | CHEWABLE_TABLET | Freq: Four times a day (QID) | ORAL | 0 refills | Status: AC

## 2019-03-15 MED ORDER — DOXYCYCLINE HYCLATE 100 MG PO TABS: 100.0000 mg | ORAL_TABLET | Freq: Two times a day (BID) | ORAL | 0 refills | Status: AC

## 2019-03-15 MED ORDER — METRONIDAZOLE 250 MG PO TABS: 250.0000 mg | ORAL_TABLET | Freq: Four times a day (QID) | ORAL | 0 refills | Status: AC

## 2019-03-16 ENCOUNTER — Telehealth: Payer: Self-pay | Admitting: Gastroenterology

## 2019-03-16 NOTE — Telephone Encounter (Signed)
Spoke to [employer] and patient. Medication has been prescribed as per Dr Bryan Lemma  ordered all questions answered.

## 2019-05-02 ENCOUNTER — Other Ambulatory Visit: Payer: Medicare Other | Admitting: *Deleted

## 2019-05-02 ENCOUNTER — Other Ambulatory Visit: Payer: Self-pay

## 2019-05-02 DIAGNOSIS — E782 Mixed hyperlipidemia: Secondary | ICD-10-CM | POA: Diagnosis not present

## 2019-05-02 LAB — HEPATIC FUNCTION PANEL
ALT: 18 IU/L (ref 0–44)
AST: 24 IU/L (ref 0–40)
Albumin: 4.1 g/dL (ref 3.7–4.7)
Alkaline Phosphatase: 62 IU/L (ref 39–117)
Bilirubin Total: 0.4 mg/dL (ref 0.0–1.2)
Bilirubin, Direct: 0.12 mg/dL (ref 0.00–0.40)
Total Protein: 6.2 g/dL (ref 6.0–8.5)

## 2019-05-02 LAB — LIPID PANEL
Chol/HDL Ratio: 2.7 ratio (ref 0.0–5.0)
Cholesterol, Total: 173 mg/dL (ref 100–199)
HDL: 64 mg/dL (ref >39)
LDL Chol Calc (NIH): 81 mg/dL (ref 0–99)
Triglycerides: 166 mg/dL — ABNORMAL HIGH (ref 0–149)
VLDL Cholesterol Cal: 28 mg/dL (ref 5–40)

## 2019-06-06 ENCOUNTER — Ambulatory Visit: Payer: Medicare Other | Attending: Internal Medicine

## 2019-06-06 DIAGNOSIS — Z20822 Contact with and (suspected) exposure to covid-19: Secondary | ICD-10-CM | POA: Diagnosis not present

## 2019-06-07 LAB — SARS-COV-2, NAA 2 DAY TAT

## 2019-06-07 LAB — NOVEL CORONAVIRUS, NAA: SARS-CoV-2, NAA: NOT DETECTED

## 2019-12-29 ENCOUNTER — Other Ambulatory Visit: Payer: Self-pay

## 2019-12-29 MED ORDER — ISOSORBIDE MONONITRATE ER 60 MG PO TB24
60.0000 mg | ORAL_TABLET | Freq: Every day | ORAL | 0 refills | Status: DC
Start: 2019-12-29 — End: 2020-03-12

## 2019-12-29 MED ORDER — METOPROLOL SUCCINATE ER 50 MG PO TB24: 50.0000 mg | ORAL_TABLET | Freq: Every day | ORAL | 0 refills | Status: DC

## 2019-12-29 MED ORDER — ROSUVASTATIN CALCIUM 40 MG PO TABS: 40.0000 mg | ORAL_TABLET | Freq: Every day | ORAL | 0 refills | Status: DC

## 2020-01-05 DIAGNOSIS — Z23 Encounter for immunization: Secondary | ICD-10-CM | POA: Diagnosis not present

## 2020-01-11 DIAGNOSIS — G5761 Lesion of plantar nerve, right lower limb: Secondary | ICD-10-CM | POA: Diagnosis not present

## 2020-01-18 ENCOUNTER — Encounter: Payer: Self-pay | Admitting: Family Medicine

## 2020-01-18 ENCOUNTER — Ambulatory Visit (INDEPENDENT_AMBULATORY_CARE_PROVIDER_SITE_OTHER): Payer: Medicare Other | Admitting: Family Medicine

## 2020-01-18 ENCOUNTER — Other Ambulatory Visit: Payer: Self-pay

## 2020-01-18 VITALS — BP 128/67 | HR 70 | Temp 98.7°F | Ht 68.0 in | Wt 183.6 lb

## 2020-01-18 DIAGNOSIS — E785 Hyperlipidemia, unspecified: Secondary | ICD-10-CM | POA: Diagnosis not present

## 2020-01-18 DIAGNOSIS — Z0001 Encounter for general adult medical examination with abnormal findings: Secondary | ICD-10-CM

## 2020-01-18 DIAGNOSIS — I10 Essential (primary) hypertension: Secondary | ICD-10-CM | POA: Diagnosis not present

## 2020-01-18 DIAGNOSIS — Z125 Encounter for screening for malignant neoplasm of prostate: Secondary | ICD-10-CM

## 2020-01-18 DIAGNOSIS — R7303 Prediabetes: Secondary | ICD-10-CM

## 2020-01-18 DIAGNOSIS — L570 Actinic keratosis: Secondary | ICD-10-CM | POA: Diagnosis not present

## 2020-01-18 DIAGNOSIS — Z0000 Encounter for general adult medical examination without abnormal findings: Secondary | ICD-10-CM | POA: Diagnosis not present

## 2020-01-18 NOTE — Progress Notes (Signed)
Patient: John Risdon, MD MRN: [id number] DOB: [birth year] PCP: Orma Flaming, MD     Subjective:  Chief Complaint  Patient presents with  . Annual Exam  . Hypertension  . Hyperlipidemia  . Prediabetes    HPI: The patient is a 74 y.o. male who presents today for annual visit. He complains of a painful are on the top of his head that started about 2-3 months ago. He has been fasting since last night, and is requesting a flu shot today.  Hypertension: Here for follow up of hypertension.  Currently on toprol-xl 50mg  daily.  Takes medication as prescribed and denies any side effects. Exercise includes hiking. Weight has been stable. Denies any chest pain, headaches, shortness of breath, vision changes, swelling in lower extremities.   Prediabetes a1c last checked was in 01/2018 and was 5.6. he is very healthy and fit. On on medication. Needs labs today.   CAD/hyperlipidemia S/p MI with stent. Currently on crestor 40mg /day. Takes as prescribed. No side effects. Lipid panel already checked this year.   Spots on head Has some scabby areas on his head that he would like me to look at.  Review of Systems  Constitutional: Negative for chills, fatigue and fever.  HENT: Negative for dental problem, ear pain, hearing loss and trouble swallowing.   Eyes: Negative for visual disturbance.  Respiratory: Negative for cough, chest tightness and shortness of breath.   Cardiovascular: Negative for chest pain, palpitations and leg swelling.  Gastrointestinal: Negative for abdominal pain, blood in stool, diarrhea and nausea.  Endocrine: Negative for cold intolerance, polydipsia, polyphagia and polyuria.  Genitourinary: Negative for dysuria and hematuria.  Musculoskeletal: Negative for arthralgias.  Skin: Negative for rash.  Neurological: Negative for dizziness and headaches.  Psychiatric/Behavioral: Negative for dysphoric mood and sleep disturbance. The patient is not nervous/anxious.      Allergies Patient has No Known Allergies.  Past Medical History Patient  has a past medical history of Basal cell carcinoma of dorsum of nose, CAD (coronary artery disease), History of nephrolithiasis, History of smallpox (childhood), HTN (hypertension), Hyperlipidemia, Kidney stone (~ 2003), and Shingles rash (2011).  Surgical History Patient  has a past surgical history that includes Tonsillectomy and adenoidectomy (1972); Vasectomy (2009); Coronary angioplasty (12/11/2011); and left heart catheterization with coronary angiogram (N/A, 12/11/2011).  Family History Pateint's family history includes AAA (abdominal aortic aneurysm) in his father; Pancreatic cancer in his cousin and maternal aunt.  Social History Patient  reports that he quit smoking about 32 years ago. His smoking use included cigarettes. He has a 4.00 pack-year smoking history. He has never used smokeless tobacco. He reports current alcohol use of about 15.0 standard drinks of alcohol per week. He reports that he does not use drugs.    Objective: Vitals:   01/18/20 1325  BP: 128/67  Pulse: 70  Temp: 98.7 F (37.1 C)  TempSrc: Temporal  SpO2: 97%  Weight: 183 lb 9.6 oz (83.3 kg)  Height: 5\' 8"  (1.727 m)    Body mass index is 27.92 kg/m.  Physical Exam Vitals reviewed.  Constitutional:      Appearance: Normal appearance. He is well-developed and normal weight.  HENT:     Head: Normocephalic and atraumatic.     Right Ear: Tympanic membrane, ear canal and external ear normal.     Left Ear: Tympanic membrane, ear canal and external ear normal.     Mouth/Throat:     Mouth: Mucous membranes are moist.  Eyes:     Extraocular  Movements: Extraocular movements intact.     Conjunctiva/sclera: Conjunctivae normal.     Pupils: Pupils are equal, round, and reactive to light.  Neck:     Thyroid: No thyromegaly.     Vascular: No carotid bruit.  Cardiovascular:     Rate and Rhythm: Normal rate and regular rhythm.      Heart sounds: Normal heart sounds. No murmur heard.   Pulmonary:     Effort: Pulmonary effort is normal.     Breath sounds: Normal breath sounds.  Abdominal:     General: Abdomen is flat. Bowel sounds are normal. There is no distension.     Palpations: Abdomen is soft.     Tenderness: There is no abdominal tenderness.  Musculoskeletal:     Cervical back: Normal range of motion and neck supple.  Lymphadenopathy:     Cervical: No cervical adenopathy.  Skin:    General: Skin is warm and dry.     Capillary Refill: Capillary refill takes less than 2 seconds.     Findings: Lesion present. No rash.     Comments: Multiple SK on crown and 3 on his left anterior shoulder.   Neurological:     General: No focal deficit present.     Mental Status: He is alert and oriented to person, place, and time.     Cranial Nerves: No cranial nerve deficit.     Coordination: Coordination normal.     Deep Tendon Reflexes: Reflexes normal.  Psychiatric:        Mood and Affect: Mood normal.        Behavior: Behavior normal.      Office Visit from 01/18/2020 in [place]  PHQ-2 Total Score 0     Procedure note Verbal consent obtained. Light freeze cycles done to area of AK on his left anterior shoulder (x3) and numerous spots on head. Multiple AK on scalp. 4 larger areas of frozen. Tolerated very well.     Assessment/plan:  1. Annual physical exam HM UTD. Has had covid vaccines + booster. Will get me dates. Flu shot today. Otherwise UTD on his HM. Very active. Doing great. F/u in one year or as needed.  Patient counseling [x]    Nutrition: Stressed importance of moderation in sodium/caffeine intake, saturated fat and cholesterol, caloric balance, sufficient intake of fresh fruits, vegetables, fiber, calcium, iron, and 1 mg of folate supplement per day (for females capable of pregnancy).  [x]    Stressed the importance of regular exercise.   []    Substance Abuse: Discussed  cessation/primary prevention of tobacco, alcohol, or other drug use; driving or other dangerous activities under the influence; availability of treatment for abuse.   [x]    Injury prevention: Discussed safety belts, safety helmets, smoke detector, smoking near bedding or upholstery.   [x]    Sexuality: Discussed sexually transmitted diseases, partner selection, use of condoms, avoidance of unintended pregnancy  and contraceptive alternatives.  [x]    Dental health: Discussed importance of regular tooth brushing, flossing, and dental visits.  [x]    Health maintenance and immunizations reviewed. Please refer to Health maintenance section.    - TSH; Future - TSH  2. Primary hypertension Blood pressure is to goal. Continue current anti-hypertensive medication: metoprolol 50mg /day. Refills given and routine lab work will be done today. Recommended routine exercise and healthy diet including DASH diet and mediterranean diet. Encouraged weight loss. F/u in 12 months. Sees cardiology.   - CBC with Differential/Platelet; Future - Microalbumin / creatinine urine ratio;  Future - Microalbumin / creatinine urine ratio - CBC with Differential/Platelet  3. Prediabetes Repeat a1c today. Has been to goal and very active. F/u in 12 months.  - Hemoglobin A1c; Future - Hemoglobin A1c  4. Actinic keratosis cryotherapy today, but too many lesions. Likely needs topical tx and r/o scc on crown.  - Ambulatory referral to Dermatology  5. Prostate cancer screening  - PSA; Future - PSA  6. Hyperlipidemia with target low density lipoprotein (LDL) cholesterol less than 70 mg/dL Lipid panel checked this year and cardiology following. LDL at 81, but need less than 70. Was supposed to repeat in 6 months, will see if I can add on.    This visit occurred during the SARS-CoV-2 public health emergency.  Safety protocols were in place, including screening questions prior to the visit, additional usage of staff PPE, and  extensive cleaning of exam room while observing appropriate contact time as indicated for disinfecting solutions.     Return in about 1 year (around 01/17/2021).    Orma Flaming, MD [place]   01/18/2020

## 2020-01-18 NOTE — Patient Instructions (Addendum)
-look great! High dose flu shot today.  -LOTS of actinic keratosis on head (pre cancer) since so many sending to derm so they can give you topical treatment. They will call with referral.   Loved seeing you! Aw    Preventive Care 65 Years and Older, Male Preventive care refers to lifestyle choices and visits with your health care provider that can promote health and wellness. This includes:  A yearly physical exam. This is also called an annual well check.  Regular dental and eye exams.  Immunizations.  Screening for certain conditions.  Healthy lifestyle choices, such as diet and exercise. What can I expect for my preventive care visit? Physical exam Your health care provider will check:  Height and weight. These may be used to calculate body mass index (BMI), which is a measurement that tells if you are at a healthy weight.  Heart rate and blood pressure.  Your skin for abnormal spots. Counseling Your health care provider may ask you questions about:  Alcohol, tobacco, and drug use.  Emotional well-being.  Home and relationship well-being.  Sexual activity.  Eating habits.  History of falls.  Memory and ability to understand (cognition).  Work and work Statistician. What immunizations do I need?  Influenza (flu) vaccine  This is recommended every year. Tetanus, diphtheria, and pertussis (Tdap) vaccine  You may need a Td booster every 10 years. Varicella (chickenpox) vaccine  You may need this vaccine if you have not already been vaccinated. Zoster (shingles) vaccine  You may need this after age 7. Pneumococcal conjugate (PCV13) vaccine  One dose is recommended after age 44. Pneumococcal polysaccharide (PPSV23) vaccine  One dose is recommended after age 39. Measles, mumps, and rubella (MMR) vaccine  You may need at least one dose of MMR if you were born in 1957 or later. You may also need a second dose. Meningococcal conjugate (MenACWY)  vaccine  You may need this if you have certain conditions. Hepatitis A vaccine  You may need this if you have certain conditions or if you travel or work in places where you may be exposed to hepatitis A. Hepatitis B vaccine  You may need this if you have certain conditions or if you travel or work in places where you may be exposed to hepatitis B. Haemophilus influenzae type b (Hib) vaccine  You may need this if you have certain conditions. You may receive vaccines as individual doses or as more than one vaccine together in one shot (combination vaccines). Talk with your health care provider about the risks and benefits of combination vaccines. What tests do I need? Blood tests  Lipid and cholesterol levels. These may be checked every 5 years, or more frequently depending on your overall health.  Hepatitis C test.  Hepatitis B test. Screening  Lung cancer screening. You may have this screening every year starting at age 97 if you have a 30-pack-year history of smoking and currently smoke or have quit within the past 15 years.  Colorectal cancer screening. All adults should have this screening starting at age 34 and continuing until age 27. Your health care provider may recommend screening at age 46 if you are at increased risk. You will have tests every 1-10 years, depending on your results and the type of screening test.  Prostate cancer screening. Recommendations will vary depending on your family history and other risks.  Diabetes screening. This is done by checking your blood sugar (glucose) after you have not eaten for a while (  fasting). You may have this done every 1-3 years.  Abdominal aortic aneurysm (AAA) screening. You may need this if you are a current or former smoker.  Sexually transmitted disease (STD) testing. Follow these instructions at home: Eating and drinking  Eat a diet that includes fresh fruits and vegetables, whole grains, lean protein, and low-fat dairy  products. Limit your intake of foods with high amounts of sugar, saturated fats, and salt.  Take vitamin and mineral supplements as recommended by your health care provider.  Do not drink alcohol if your health care provider tells you not to drink.  If you drink alcohol: ? Limit how much you have to 0-2 drinks a day. ? Be aware of how much alcohol is in your drink. In the [place] one drink equals one 12 oz bottle of beer (355 mL), one 5 oz glass of wine (148 mL), or one 1 oz glass of hard liquor (44 mL). Lifestyle  Take daily care of your teeth and gums.  Stay active. Exercise for at least 30 minutes on 5 or more days each week.  Do not use any products that contain nicotine or tobacco, such as cigarettes, e-cigarettes, and chewing tobacco. If you need help quitting, ask your health care provider.  If you are sexually active, practice safe sex. Use a condom or other form of protection to prevent STIs (sexually transmitted infections).  Talk with your health care provider about taking a low-dose aspirin or statin. What's next?  Visit your health care provider once a year for a well check visit.  Ask your health care provider how often you should have your eyes and teeth checked.  Stay up to date on all vaccines. This information is not intended to replace advice given to you by your health care provider. Make sure you discuss any questions you have with your health care provider. Document Revised: 02/11/2018 Document Reviewed: 02/11/2018 Elsevier Patient Education  2020 [employer].

## 2020-01-19 ENCOUNTER — Other Ambulatory Visit: Payer: Self-pay | Admitting: Family Medicine

## 2020-01-19 DIAGNOSIS — E785 Hyperlipidemia, unspecified: Secondary | ICD-10-CM

## 2020-01-19 LAB — CBC WITH DIFFERENTIAL/PLATELET
Absolute Monocytes: 513 cells/uL (ref 200–950)
Basophils Absolute: 30 cells/uL (ref 0–200)
Basophils Relative: 0.5 %
Eosinophils Absolute: 201 cells/uL (ref 15–500)
Eosinophils Relative: 3.4 %
HCT: 40.9 % (ref 38.5–50.0)
Hemoglobin: 13.7 g/dL (ref 13.2–17.1)
Lymphs Abs: 1333 cells/uL (ref 850–3900)
MCH: 31.1 pg (ref 27.0–33.0)
MCHC: 33.5 g/dL (ref 32.0–36.0)
MCV: 92.7 fL (ref 80.0–100.0)
MPV: 10.1 fL (ref 7.5–12.5)
Monocytes Relative: 8.7 %
Neutro Abs: 3823 cells/uL (ref 1500–7800)
Neutrophils Relative %: 64.8 %
Platelets: 192 10*3/uL (ref 140–400)
RBC: 4.41 10*6/uL (ref 4.20–5.80)
RDW: 13.4 % (ref 11.0–15.0)
Total Lymphocyte: 22.6 %
WBC: 5.9 10*3/uL (ref 3.8–10.8)

## 2020-01-19 LAB — PSA: PSA: 2.69 ng/mL (ref <=4.0)

## 2020-01-19 LAB — TSH: TSH: 3.05 mIU/L (ref 0.40–4.50)

## 2020-01-19 LAB — HEMOGLOBIN A1C
Hgb A1c MFr Bld: 5.4 % of total Hgb (ref <5.7)
Mean Plasma Glucose: 108 (calc)
eAG (mmol/L): 6 (calc)

## 2020-01-19 NOTE — Progress Notes (Signed)
Lip

## 2020-01-21 ENCOUNTER — Encounter: Payer: Self-pay | Admitting: Family Medicine

## 2020-02-02 ENCOUNTER — Ambulatory Visit: Payer: Medicare Other | Admitting: Physician Assistant

## 2020-02-02 ENCOUNTER — Encounter: Payer: Self-pay | Admitting: Physician Assistant

## 2020-02-02 ENCOUNTER — Other Ambulatory Visit: Payer: Self-pay

## 2020-02-02 VITALS — BP 130/72 | HR 65 | Ht 68.0 in | Wt 175.6 lb

## 2020-02-02 DIAGNOSIS — E782 Mixed hyperlipidemia: Secondary | ICD-10-CM

## 2020-02-02 DIAGNOSIS — I10 Essential (primary) hypertension: Secondary | ICD-10-CM

## 2020-02-02 DIAGNOSIS — I25119 Atherosclerotic heart disease of native coronary artery with unspecified angina pectoris: Secondary | ICD-10-CM

## 2020-02-02 NOTE — Patient Instructions (Signed)
Medication Instructions:  Your physician recommends that you continue on your current medications as directed. Please refer to the Current Medication list given to you today.  *If you need a refill on your cardiac medications before your next appointment, please call your pharmacy*   Lab Work: TODAY:  LIPID  If you have labs (blood work) drawn today and your tests are completely normal, you will receive your results only by: Marland Kitchen MyChart Message (if you have MyChart) OR . A paper copy in the mail If you have any lab test that is abnormal or we need to change your treatment, we will call you to review the results.   Testing/Procedures: None ordered   Follow-Up: At [facility], you and your health needs are our priority.  As part of our continuing mission to provide you with exceptional heart care, we have created designated Provider Care Teams.  These Care Teams include your primary Cardiologist (physician) and Advanced Practice Providers (APPs -  Physician Assistants and Nurse Practitioners) who all work together to provide you with the care you need, when you need it.  We recommend signing up for the patient portal called "MyChart".  Sign up information is provided on this After Visit Summary.  MyChart is used to connect with patients for Virtual Visits (Telemedicine).  Patients are able to view lab/test results, encounter notes, upcoming appointments, etc.  Non-urgent messages can be sent to your provider as well.   To learn more about what you can do with MyChart, go to [email or web address].    Your next appointment:   12 month(s)  The format for your next appointment:   In Person  Provider:   You may see Sherren Mocha, MD or one of the following Advanced Practice Providers on your designated Care Team:    Richardson Dopp, PA-C  Robbie Lis, [place]    Other Instructions

## 2020-02-02 NOTE — Progress Notes (Signed)
Cardiology Office Note:    Date:  02/02/2020   ID:  John Stein, DOB 10-15-1945, MRN [id number]  PCP:  Orma Flaming, MD  [facility] Cardiologist:  Sherren Mocha, MD  [facility] Electrophysiologist:  None   Chief Complaint: yearly follow up for CAD  History of Present Illness:    John Stein is a 74 y.o. male with a hx of CAD s/p POVA to Dx in 2013, HTN and HLD seen for follow up.   Low risk stress test in 2017.   Here today for yearly follow up.  He is fasting for his cholesterol check.  He is very active at his baseline.  Just cut  2 tress prior to his appointment.  He does hiking 3-4 times per year in the mountains.  Rare occasional very short-lived chest discomfort.  Denies shortness of breath, orthopnea, PND, syncope, lower extremity edema or melena.   Past Medical History:  Diagnosis Date   Basal cell carcinoma of dorsum of nose    CAD (coronary artery disease)    a. 12/11/2011 Ex MV: Med/Sev ant & antlat ischemia, EF 53%, 34mm ST dep V4-V6;  b. 12/11/2011 Cath/PTCA: LM nl, LAD 20-45m, D1 99ost (Tx w/ PTCA only), LCX 40/74m, RCA diff nonobs dzs, EF 55%, sublte dist antlat HK.     History of nephrolithiasis    History of smallpox childhood   HTN (hypertension)    Hyperlipidemia    Kidney stone ~ 2003   Shingles rash 2011   right scalp    Past Surgical History:  Procedure Laterality Date   CORONARY ANGIOPLASTY  12/11/2011   LEFT HEART CATHETERIZATION WITH CORONARY ANGIOGRAM N/A 12/11/2011   Procedure: LEFT HEART CATHETERIZATION WITH CORONARY ANGIOGRAM;  Surgeon: Sherren Mocha, MD;  Location: [facility] CATH LAB;  Service: Cardiovascular;  Laterality: N/A;   [place]  2009    Current Medications: Current Meds  Medication Sig   aspirin 81 MG chewable tablet Chew 1 tablet (81 mg total) by mouth daily.   isosorbide mononitrate (IMDUR) 60 MG 24 hr tablet Take 1 tablet (60 mg total) by mouth daily. Please keep  upcoming appt in December for future refills. Thank you   metoprolol succinate (TOPROL-XL) 50 MG 24 hr tablet Take 1 tablet (50 mg total) by mouth daily. Please keep upcoming appt in December for future refills. Thank you   nitroGLYCERIN (NITROSTAT) 0.4 MG SL tablet Place 1 tablet (0.4 mg total) under the tongue every 5 (five) minutes as needed for chest pain.   rosuvastatin (CRESTOR) 40 MG tablet Take 1 tablet (40 mg total) by mouth daily. Please keep upcoming appt in December for future refills. Thank you     Allergies:   Patient has no known allergies.   Social History   Socioeconomic History   Marital status: Married    Spouse name: Not on file   Number of children: 4   Years of education: 24   Highest education level: Not on file  Occupational History   Occupation: neurosurgeon    Comment: [employer]  Tobacco Use   Smoking status: Former Smoker    Packs/day: 0.50    Years: 8.00    Pack years: 4.00    Types: Cigarettes    Quit date: 03/04/1987    Years since quitting: 32.9   Smokeless tobacco: Never Used   Tobacco comment: 12/11/2011 "stopped smoking cigarettes ~ 25 yr ago"  Vaping Use   Vaping Use: Never used  Substance  and Sexual Activity   Alcohol use: Yes    Alcohol/week: 15.0 standard drinks    Types: 15 Glasses of wine per week    Comment: wine with dinner 2-3    Drug use: No   Sexual activity: Yes    Partners: Female  Other Topics Concern   Not on file  Social History [place] - '72 - [place], [place]; surgical resident [place]; [place]. [place] '84. Married 10 years - divorced. Married '90 - ; 2 sons - ;'30, '02; 2 dtrs - '91, '96. Work - Engineer, technical sales. Marriage in good health. I son - ice skater/competitive, 1 son Chef in the [place] area.   Social Determinants of Health   Financial Resource Strain:    Difficulty of Paying Living Expenses: Not on file  Food Insecurity:    Worried About Charity fundraiser in the Last  Year: Not on file   [employer] of Food in the Last Year: Not on file  Transportation Needs:    Lack of Transportation (Medical): Not on file   Lack of Transportation (Non-Medical): Not on file  Physical Activity:    Days of Exercise per Week: Not on file   Minutes of Exercise per Session: Not on file  Stress:    Feeling of Stress : Not on file  Social Connections:    Frequency of Communication with Friends and Family: Not on file   Frequency of Social Gatherings with Friends and Family: Not on file   Attends Religious Services: Not on file   Active Member of Clubs or Organizations: Not on file   Attends Archivist Meetings: Not on file   Marital Status: Not on file     Family History: The patient's family history includes AAA (abdominal aortic aneurysm) in his father; Pancreatic cancer in his cousin and maternal aunt. There is no history of Diabetes, Hypertension, Colon cancer, Stomach cancer, or Throat cancer.   ROS:   Please see the history of present illness.    All other systems reviewed and are negative.   EKGs/Labs/Other Studies Reviewed:    The following studies were reviewed today:  ABDOMINAL AORTA STUDY 01/2018 Summary:  Abdominal Aorta: No evidence of an abdominal aortic aneurysm was  visualized. The largest aortic measurement is 2.8 cm.  Stenosis: No evidence of significant focal stenosis.     Stress test 10/2015  Nuclear stress EF: 49%.  Blood pressure demonstrated a hypertensive response to exercise.  There was no ST segment deviation noted during stress.  This is a low risk study.  The left ventricular ejection fraction is mildly decreased (45-54%).   Normal resting and stress perfusion. No ischemia or infarction EF 49% but visually looks normal. HTN response to exercise    EKG:  EKG is ordered today.  The ekg ordered today demonstrates NSR  Recent Labs: 05/02/2019: ALT 18 01/18/2020: Hemoglobin 13.7; Platelets 192; TSH 3.05   Recent Lipid Panel    Component Value Date/Time   CHOL 173 05/02/2019 0913   TRIG 166 (H) 05/02/2019 0913   HDL 64 05/02/2019 0913   CHOLHDL 2.7 05/02/2019 0913   CHOLHDL 3 07/22/2017 1139   VLDL 13.2 07/22/2017 1139   LDLCALC 81 05/02/2019 0913   LDLDIRECT 156.5 12/01/2011 0958    Physical Exam:    VS:  BP 130/72    Pulse 65    Ht 5\' 8"  (1.727 m)    Wt 175 lb 9.6 oz (79.7 kg)  SpO2 95%    BMI 26.70 kg/m     Wt Readings from Last 3 Encounters:  02/02/20 175 lb 9.6 oz (79.7 kg)  01/18/20 183 lb 9.6 oz (83.3 kg)  03/10/19 182 lb (82.6 kg)     GEN:  Well nourished, well developed in no acute distress HEENT: Normal NECK: No JVD; No carotid bruits LYMPHATICS: No lymphadenopathy CARDIAC: RRR, no murmurs, rubs, gallops RESPIRATORY:  Clear to auscultation without rales, wheezing or rhonchi  ABDOMEN: Soft, non-tender, non-distended MUSCULOSKELETAL:  No edema; No deformity  SKIN: Warm and dry NEUROLOGIC:  Alert and oriented x 3 PSYCHIATRIC:  Normal affect   ASSESSMENT AND PLAN:    1. CAD Rare short-lived chest discomfort with extreme exertion while working in [employer].  He is very active at his baseline working in yards without any symptoms. Aspirin, statin, beta-blocker and Imdur  2. HTN Blood pressure stable on metoprolol succinate.  3. HLD -He is due for his triglyceride check -He is fasting - Continue Crestor 40 mg daily  Medication Adjustments/Labs and Tests Ordered: Current medicines are reviewed at length with the patient today.  Concerns regarding medicines are outlined above.  Orders Placed This Encounter  Procedures   Lipid panel   EKG 12-Lead   No orders of the defined types were placed in this encounter.   Patient Instructions  Medication Instructions:  Your physician recommends that you continue on your current medications as directed. Please refer to the Current Medication list given to you today.  *If you need a refill on your cardiac  medications before your next appointment, please call your pharmacy*   Lab Work: TODAY:  LIPID  If you have labs (blood work) drawn today and your tests are completely normal, you will receive your results only by:  [place] (if you have MyChart) OR  A paper copy in the mail If you have any lab test that is abnormal or we need to change your treatment, we will call you to review the results.   Testing/Procedures: None ordered   Follow-Up: At [facility], you and your health needs are our priority.  As part of our continuing mission to provide you with exceptional heart care, we have created designated Provider Care Teams.  These Care Teams include your primary Cardiologist (physician) and Advanced Practice Providers (APPs -  Physician Assistants and Nurse Practitioners) who all work together to provide you with the care you need, when you need it.  We recommend signing up for the patient portal called "MyChart".  Sign up information is provided on this After Visit Summary.  MyChart is used to connect with patients for Virtual Visits (Telemedicine).  Patients are able to view lab/test results, encounter notes, upcoming appointments, etc.  Non-urgent messages can be sent to your provider as well.   To learn more about what you can do with MyChart, go to [email or web address].    Your next appointment:   12 month(s)  The format for your next appointment:   In Person  Provider:   You may see Sherren Mocha, MD or one of the following Advanced Practice Providers on your designated Care Team:    Richardson Dopp, PA-C  Robbie Lis, PA-C    Other Instructions      Signed, Leanor Kail, [place]  02/02/2020 3:59 PM    [place]

## 2020-02-03 LAB — LIPID PANEL
Chol/HDL Ratio: 2.5 ratio (ref 0.0–5.0)
Cholesterol, Total: 156 mg/dL (ref 100–199)
HDL: 62 mg/dL (ref >39)
LDL Chol Calc (NIH): 80 mg/dL (ref 0–99)
Triglycerides: 73 mg/dL (ref 0–149)
VLDL Cholesterol Cal: 14 mg/dL (ref 5–40)

## 2020-02-03 NOTE — Progress Notes (Signed)
Normal results sent via mychart

## 2020-02-10 ENCOUNTER — Telehealth: Payer: Medicare Other | Admitting: Physician Assistant

## 2020-03-10 ENCOUNTER — Other Ambulatory Visit: Payer: Self-pay | Admitting: Physician Assistant

## 2020-03-12 ENCOUNTER — Other Ambulatory Visit: Payer: Self-pay

## 2020-03-12 MED ORDER — ROSUVASTATIN CALCIUM 40 MG PO TABS
40.0000 mg | ORAL_TABLET | Freq: Every day | ORAL | 3 refills | Status: DC
Start: 2020-03-12 — End: 2021-03-06

## 2020-03-12 MED ORDER — ISOSORBIDE MONONITRATE ER 60 MG PO TB24
60.0000 mg | ORAL_TABLET | Freq: Every day | ORAL | 3 refills | Status: DC
Start: 2020-03-12 — End: 2021-03-06

## 2020-03-13 ENCOUNTER — Other Ambulatory Visit: Payer: Self-pay | Admitting: *Deleted

## 2020-03-13 MED ORDER — METOPROLOL SUCCINATE ER 50 MG PO TB24
50.0000 mg | ORAL_TABLET | Freq: Every day | ORAL | 2 refills | Status: DC
Start: 2020-03-13 — End: 2020-12-11

## 2020-07-26 ENCOUNTER — Ambulatory Visit (INDEPENDENT_AMBULATORY_CARE_PROVIDER_SITE_OTHER)
Admission: RE | Admit: 2020-07-26 | Discharge: 2020-07-26 | Disposition: A | Discharge status: Home / Self Care | Payer: Medicare Other | Source: Ambulatory Visit | Attending: Family Medicine | Admitting: Family Medicine

## 2020-07-26 ENCOUNTER — Encounter: Payer: Self-pay | Admitting: Family Medicine

## 2020-07-26 ENCOUNTER — Other Ambulatory Visit: Payer: Self-pay

## 2020-07-26 ENCOUNTER — Ambulatory Visit (INDEPENDENT_AMBULATORY_CARE_PROVIDER_SITE_OTHER): Payer: Medicare Other | Admitting: Family Medicine

## 2020-07-26 VITALS — BP 120/74 | HR 60 | Temp 97.4°F | Ht 68.0 in | Wt 183.0 lb

## 2020-07-26 DIAGNOSIS — I951 Orthostatic hypotension: Secondary | ICD-10-CM

## 2020-07-26 DIAGNOSIS — M542 Cervicalgia: Secondary | ICD-10-CM

## 2020-07-26 DIAGNOSIS — M47812 Spondylosis without myelopathy or radiculopathy, cervical region: Secondary | ICD-10-CM | POA: Diagnosis not present

## 2020-07-26 DIAGNOSIS — M5412 Radiculopathy, cervical region: Secondary | ICD-10-CM

## 2020-07-26 DIAGNOSIS — M25519 Pain in unspecified shoulder: Secondary | ICD-10-CM | POA: Diagnosis not present
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
## 2020-08-08 DIAGNOSIS — H524 Presbyopia: Secondary | ICD-10-CM | POA: Diagnosis not present

## 2020-08-16 ENCOUNTER — Encounter: Payer: Self-pay | Admitting: Family Medicine

## 2020-12-11 ENCOUNTER — Other Ambulatory Visit: Payer: Self-pay | Admitting: Cardiovascular Disease

## 2020-12-12 ENCOUNTER — Encounter: Payer: Self-pay | Admitting: Interventional Cardiology

## 2020-12-12 ENCOUNTER — Ambulatory Visit: Payer: Medicare Other | Admitting: Interventional Cardiology

## 2020-12-12 ENCOUNTER — Other Ambulatory Visit: Payer: Self-pay

## 2020-12-12 VITALS — BP 118/68 | HR 67 | Ht 68.0 in | Wt 176.0 lb

## 2020-12-12 DIAGNOSIS — R072 Precordial pain: Secondary | ICD-10-CM

## 2020-12-12 DIAGNOSIS — I10 Essential (primary) hypertension: Secondary | ICD-10-CM | POA: Diagnosis not present

## 2020-12-12 DIAGNOSIS — E782 Mixed hyperlipidemia: Secondary | ICD-10-CM

## 2020-12-12 DIAGNOSIS — I25119 Atherosclerotic heart disease of native coronary artery with unspecified angina pectoris: Secondary | ICD-10-CM

## 2020-12-12 LAB — BASIC METABOLIC PANEL
BUN/Creatinine Ratio: 21 (ref 10–24)
BUN: 18 mg/dL (ref 8–27)
CO2: 25 mmol/L (ref 20–29)
Calcium: 9.2 mg/dL (ref 8.6–10.2)
Chloride: 106 mmol/L (ref 96–106)
Creatinine, Ser: 0.87 mg/dL (ref 0.76–1.27)
Glucose: 89 mg/dL (ref 70–99)
Potassium: 4.6 mmol/L (ref 3.5–5.2)
Sodium: 142 mmol/L (ref 134–144)
eGFR: 91 mL/min/{1.73_m2} (ref >59)

## 2020-12-12 NOTE — Progress Notes (Signed)
Cardiology Office Note   Date:  12/12/2020   ID:  John Stein, DOB [birth year], MRN [id number]  PCP:  Orma Flaming, MD    No chief complaint on file.  CAD  Wt Readings from Last 3 Encounters:  12/12/20 176 lb (79.8 kg)  07/26/20 183 lb (83 kg)  02/02/20 175 lb 9.6 oz (79.7 kg)       History of Present Illness: John Stein is a 75 y.o. male  with CAD.  Cath in 2013 showed sivere ostial diagonal disease which was treated with PTCA.   2017 stress test showed: "Nuclear stress EF: 49%. Blood pressure demonstrated a hypertensive response to exercise. There was no ST segment deviation noted during stress. This is a low risk study. The left ventricular ejection fraction is mildly decreased (45-54%).   Normal resting and stress perfusion. No ischemia or infarction EF 49% but visually looks normal. HTN response to exercise "   He has had to use some NTG [place] in the past week.  Sx resolve after a few minutes.  This is unusual for him.  No other associated symptoms.  Past Medical History:  Diagnosis Date   Basal cell carcinoma of dorsum of nose    CAD (coronary artery disease)    a. 12/11/2011 Ex MV: Med/Sev ant & antlat ischemia, EF 53%, 5mm ST dep V4-V6;  b. 12/11/2011 Cath/PTCA: LM nl, LAD 20-41m, D1 99ost (Tx w/ PTCA only), LCX 40/50m, RCA diff nonobs dzs, EF 55%, sublte dist antlat HK.     History of nephrolithiasis    History of smallpox childhood   HTN (hypertension)    Hyperlipidemia    Kidney stone ~ 2003   Shingles rash 2011   right scalp    Past Surgical History:  Procedure Laterality Date   CORONARY ANGIOPLASTY  12/11/2011   LEFT HEART CATHETERIZATION WITH CORONARY ANGIOGRAM N/A 12/11/2011   Procedure: LEFT HEART CATHETERIZATION WITH CORONARY ANGIOGRAM;  Surgeon: Sherren Mocha, MD;  Location: [facility] CATH LAB;  Service: Cardiovascular;  Laterality: N/A;   TONSILLECTOMY AND ADENOIDECTOMY  1972   VASECTOMY  2009     Current  Outpatient Medications  Medication Sig Dispense Refill   acetaminophen (TYLENOL) 325 MG tablet Take 650 mg by mouth every 6 (six) hours as needed.     aspirin 81 MG chewable tablet Chew 1 tablet (81 mg total) by mouth daily.     isosorbide mononitrate (IMDUR) 60 MG 24 hr tablet Take 1 tablet (60 mg total) by mouth daily. 90 tablet 3   metoprolol succinate (TOPROL-XL) 50 MG 24 hr tablet Take 1 tablet (50 mg total) by mouth daily. Please keep upcomming appt in January 2023 with Cardiologist before anymore refills. Thank you Final Attempt 90 tablet 0   nitroGLYCERIN (NITROSTAT) 0.4 MG SL tablet PLACE 1 TABLET UNDER THE TONGUE EVERY 5 MINUTES AS NEEDED FOR CHEST PAIN 25 tablet 6   rosuvastatin (CRESTOR) 40 MG tablet Take 1 tablet (40 mg total) by mouth daily. 90 tablet 3   No current facility-administered medications for this visit.    Allergies:   Patient has no known allergies.    Social History:  The patient  reports that he quit smoking about 33 years ago. His smoking use included cigarettes. He has a 4.00 pack-year smoking history. He has never used smokeless tobacco. He reports current alcohol use of about 15.0 standard drinks per week. He reports that he does not use drugs.   Family History:  The patient's family history includes AAA (abdominal aortic aneurysm) in his father; Pancreatic cancer in his cousin and maternal aunt.    ROS:  Please see the history of present illness.   Otherwise, review of systems are positive for frequent travel to the [place].   All other systems are reviewed and negative.    PHYSICAL EXAM: VS:  BP 118/68   Pulse 67   Ht 5\' 8"  (1.727 m)   Wt 176 lb (79.8 kg)   SpO2 98%   BMI 26.76 kg/m  , BMI Body mass index is 26.76 kg/m. GEN: Well nourished, well developed, in no acute distress HEENT: normal Neck: no JVD, carotid bruits, or masses Cardiac: RRR; no murmurs, rubs, or gallops,no edema  Respiratory:  clear to auscultation bilaterally, normal work of  breathing GI: soft, nontender, nondistended, + BS MS: no deformity or atrophy Skin: warm and dry, no rash Neuro:  Strength and sensation are intact Psych: euthymic mood, full affect   EKG:   The ekg ordered today demonstrates NSR, no ST changes   Recent Labs: 01/18/2020: Hemoglobin 13.7; Platelets 192; TSH 3.05   Lipid Panel    Component Value Date/Time   CHOL 156 02/02/2020 1602   TRIG 73 02/02/2020 1602   HDL 62 02/02/2020 1602   CHOLHDL 2.5 02/02/2020 1602   CHOLHDL 3 07/22/2017 1139   VLDL 13.2 07/22/2017 1139   LDLCALC 80 02/02/2020 1602   LDLDIRECT 156.5 12/01/2011 0958     Other studies Reviewed: Additional studies/ records that were reviewed today with results demonstrating: prior cath reviewed.   ASSESSMENT AND PLAN:  CAD: Prior PTCA in 2013.  Some increase in anginal symptoms.  Not very limiting at this point.  We discussed increasing Imdur but he feels his symptoms are manageable.  Will plan for coronary CTA to evaluate for any high risk lesions and large vessels.  Further plans based on CT results. Hyperlipidemia: Continue high potency Crestor.  LDL 80 in December 2021.    Current medicines are reviewed at length with the patient today.  The patient concerns regarding his medicines were addressed.  The following changes have been made:  No change  Labs/ tests ordered today include: CTA No orders of the defined types were placed in this encounter.   Recommend 150 minutes/week of aerobic exercise Low fat, low carb, high fiber diet recommended  Disposition:   FU in based on CT results   Signed, Larae Grooms, MD  12/12/2020 11:49 AM    [facility] [address], [place], [place]  [address] Phone: [phone]; Fax: [phone]

## 2020-12-12 NOTE — Patient Instructions (Signed)
Medication Instructions:  Your physician recommends that you continue on your current medications as directed. Please refer to the Current Medication list given to you today.  *If you need a refill on your cardiac medications before your next appointment, please call your pharmacy*   Lab Work: Lab work to be done today--BMP If you have labs (blood work) drawn today and your tests are completely normal, you will receive your results only by: [place] (if you have MyChart) OR A paper copy in the mail If you have any lab test that is abnormal or we need to change your treatment, we will call you to review the results.   Testing/Procedures: Your physician has requested that you have cardiac CT. Cardiac computed tomography (CT) is a painless test that uses an x-ray machine to take clear, detailed pictures of your heart. For further information please visit [email or web address]. Please follow instruction sheet as given.     Follow-Up: At [facility], you and your health needs are our priority.  As part of our continuing mission to provide you with exceptional heart care, we have created designated Provider Care Teams.  These Care Teams include your primary Cardiologist (physician) and Advanced Practice Providers (APPs -  Physician Assistants and Nurse Practitioners) who all work together to provide you with the care you need, when you need it.  We recommend signing up for the patient portal called "MyChart".  Sign up information is provided on this After Visit Summary.  MyChart is used to connect with patients for Virtual Visits (Telemedicine).  Patients are able to view lab/test results, encounter notes, upcoming appointments, etc.  Non-urgent messages can be sent to your provider as well.   To learn more about what you can do with MyChart, go to [email or web address].    Your next appointment:   January 18,2023  The format for your next appointment:   In Person  Provider:    Ermalinda Barrios, PA   Other Instructions     Your cardiac CT will be scheduled at one of the below locations:   [facility] [address] [place], [place] [address] [phone]  [place] [address] [place],  [address] [phone]  If scheduled at [facility], please arrive at the [facility] main entrance (entrance A) of [facility] 30 minutes prior to test start time. Proceed to the [facility] Radiology Department (first floor) to check-in and test prep.  If scheduled at [facility], please arrive 15 mins early for check-in and test prep.  Please follow these instructions carefully (unless otherwise directed):  Hold all erectile dysfunction medications at least 3 days (72 hrs) prior to test.  On the Night Before the Test: Be sure to Drink plenty of water. Do not consume any caffeinated/decaffeinated beverages or chocolate 12 hours prior to your test. Do not take any antihistamines 12 hours prior to your test.  On the Day of the Test: Drink plenty of water until 1 hour prior to the test. Do not eat any food 4 hours prior to the test. You may take your regular medications prior to the test.  Take metoprolol (Lopressor) two hours prior to test.--Take 2 of your 50 mg tablets HOLD Furosemide/Hydrochlorothiazide morning of the test.        After the Test: Drink plenty of water. After receiving IV contrast, you may experience a mild flushed feeling. This is normal. On occasion, you may  experience a mild rash up to 24 hours after the test. This is not dangerous. If this occurs, you can take Benadryl 25 mg and increase your fluid intake. If you experience trouble breathing, this can be serious. If it is severe call 911 IMMEDIATELY. If it is mild, please call our office. If you take any of these medications: Glipizide/Metformin, Avandament, Glucavance,  please do not take 48 hours after completing test unless otherwise instructed.  Please allow 2-4 weeks for scheduling of routine cardiac CTs. Some insurance companies require a pre-authorization which may delay scheduling of this test.   For non-scheduling related questions, please contact the cardiac imaging nurse navigator should you have any questions/concerns: Marchia Bond, Cardiac Imaging Nurse Navigator Gordy Clement, Cardiac Imaging Nurse Navigator [facility] Direct Office Dial: [phone]   For scheduling needs, including cancellations and rescheduling, please call [place], [phone].

## 2020-12-28 ENCOUNTER — Telehealth (HOSPITAL_COMMUNITY): Payer: Self-pay | Admitting: *Deleted

## 2020-12-28 NOTE — Telephone Encounter (Signed)
Reaching out to patient to offer assistance regarding upcoming cardiac imaging study; pt verbalizes understanding of appt date/time, parking situation and where to check in, pre-test NPO status and medications ordered, and verified current allergies; name and call back number provided for further questions should they arise  Gordy Clement RN Navigator Cardiac Imaging [facility] [phone] office [phone] cell  Patient will take his daily metoprolol succinate two hours prior to cardiac CT scan.

## 2020-12-31 ENCOUNTER — Ambulatory Visit (HOSPITAL_COMMUNITY)
Admission: RE | Admit: 2020-12-31 | Discharge: 2020-12-31 | Disposition: A | Discharge status: Home / Self Care | Payer: Medicare Other | Source: Ambulatory Visit | Attending: Interventional Cardiology | Admitting: Interventional Cardiology

## 2020-12-31 ENCOUNTER — Other Ambulatory Visit: Payer: Self-pay

## 2020-12-31 ENCOUNTER — Other Ambulatory Visit (HOSPITAL_COMMUNITY): Payer: Self-pay | Admitting: Emergency Medicine

## 2020-12-31 DIAGNOSIS — R072 Precordial pain: Secondary | ICD-10-CM | POA: Insufficient documentation

## 2020-12-31 DIAGNOSIS — R079 Chest pain, unspecified: Secondary | ICD-10-CM | POA: Diagnosis not present

## 2020-12-31 DIAGNOSIS — R931 Abnormal findings on diagnostic imaging of heart and coronary circulation: Secondary | ICD-10-CM

## 2020-12-31 MED ORDER — IOHEXOL 350 MG/ML SOLN
95.0000 mL | Freq: Once | INTRAVENOUS | Status: AC | PRN
  Administered 2020-12-31: 95 mL via INTRAVENOUS

## 2020-12-31 MED ORDER — NITROGLYCERIN 0.4 MG SL SUBL
0.8000 mg | SUBLINGUAL_TABLET | Freq: Once | SUBLINGUAL | Status: AC
  Administered 2020-12-31: 0.8 mg via SUBLINGUAL

## 2020-12-31 MED ORDER — NITROGLYCERIN 0.4 MG SL SUBL
SUBLINGUAL_TABLET | SUBLINGUAL | Status: AC
  Filled 2020-12-31: qty 2

## 2021-01-01 DIAGNOSIS — R072 Precordial pain: Secondary | ICD-10-CM | POA: Diagnosis not present

## 2021-01-01 NOTE — Progress Notes (Signed)
Moderate LAD , diagonal and OM disease but negative by FFR.  Not sure what you wanted to do so have not sent result to patient.

## 2021-03-06 ENCOUNTER — Other Ambulatory Visit: Payer: Self-pay

## 2021-03-06 ENCOUNTER — Encounter: Payer: Self-pay | Admitting: Cardiovascular Disease

## 2021-03-06 ENCOUNTER — Ambulatory Visit (INDEPENDENT_AMBULATORY_CARE_PROVIDER_SITE_OTHER): Payer: Medicare Other | Admitting: Cardiovascular Disease

## 2021-03-06 VITALS — BP 120/72 | HR 66 | Ht 68.0 in | Wt 179.4 lb

## 2021-03-06 DIAGNOSIS — E782 Mixed hyperlipidemia: Secondary | ICD-10-CM | POA: Diagnosis not present

## 2021-03-06 DIAGNOSIS — R079 Chest pain, unspecified: Secondary | ICD-10-CM

## 2021-03-06 DIAGNOSIS — I25119 Atherosclerotic heart disease of native coronary artery with unspecified angina pectoris: Secondary | ICD-10-CM | POA: Diagnosis not present

## 2021-03-06 MED ORDER — ISOSORBIDE MONONITRATE ER 60 MG PO TB24: 60.0000 mg | ORAL_TABLET | Freq: Every day | ORAL | 3 refills | Status: DC

## 2021-03-06 MED ORDER — NITROGLYCERIN 0.4 MG SL SUBL: SUBLINGUAL_TABLET | SUBLINGUAL | 6 refills | Status: DC

## 2021-03-06 MED ORDER — METOPROLOL SUCCINATE ER 50 MG PO TB24: 50.0000 mg | ORAL_TABLET | Freq: Every day | ORAL | 3 refills | Status: DC

## 2021-03-06 MED ORDER — ROSUVASTATIN CALCIUM 40 MG PO TABS: 40.0000 mg | ORAL_TABLET | Freq: Every day | ORAL | 3 refills | Status: DC

## 2021-03-06 NOTE — Progress Notes (Signed)
Cardiology Office Note:    Date:  03/06/2021   ID:  John Stein, DOB 01/27/1946, MRN [id number]  PCP:  John Stein   [facility] Providers Cardiologist:  John Mocha, MD     Referring MD: No ref. provider found   Chief Complaint  Patient presents with   Coronary Artery Disease    History of Present Illness:    John Stein is a 76 y.o. male with a hx of coronary artery disease, presenting for follow-up evaluation.  The patient presented with anginal chest pain in 2013 and underwent cardiac catheterization demonstrating critical stenosis of a large first diagonal branch.  He was noted to have nonobstructive disease elsewhere.  Because of the ostial location of the lesion, he underwent [place] angioplasty alone with a good angiographic result.  He has been managed medically since that time on a combination of isosorbide and metoprolol for antianginal therapy.  He has been able to tolerate a statin drug.  The patient is here with his wife today.  He is back and forth between here and [place].  Last time he was in [place] he was experiencing more chest pain and he came in for evaluation.  A coronary CTA was ordered and demonstrated mixed plaque with a napkin ring appearance of the LAD at the first diagonal origin.  This was felt to represent up to a 70 to 99% stenosis.  However, the CT-FFR was normal in all vessels including the LAD and diagonal branches with FFR greater than 0.9.  The patient has done well since he has been back and has had no further angina.  He is active and doing gardening without any problems.  He denies recurrence of chest pain.  He denies dyspnea, palpitations, orthopnea, or PND.  Past Medical History:  Diagnosis Date   Basal cell carcinoma of dorsum of nose    CAD (coronary artery disease)    a. 12/11/2011 Ex MV: Med/Sev ant & antlat ischemia, EF 53%, 50mm ST dep V4-V6;  b. 12/11/2011 Cath/PTCA: LM nl, LAD 20-67m, D1 99ost (Tx w/ PTCA only), LCX 40/68m,  RCA diff nonobs dzs, EF 55%, sublte dist antlat HK.     History of nephrolithiasis    History of smallpox childhood   HTN (hypertension)    Hyperlipidemia    Kidney stone ~ 2003   Shingles rash 2011   right scalp    Past Surgical History:  Procedure Laterality Date   CORONARY ANGIOPLASTY  12/11/2011   LEFT HEART CATHETERIZATION WITH CORONARY ANGIOGRAM N/A 12/11/2011   Procedure: LEFT HEART CATHETERIZATION WITH CORONARY ANGIOGRAM;  Surgeon: John Mocha, MD;  [facility] CATH LAB;  Service: Cardiovascular;  Laterality: N/A;   [place]  2009    Current Medications: Current Meds  Medication Sig   acetaminophen (TYLENOL) 325 MG tablet Take 650 mg by mouth every 6 (six) hours as needed.   aspirin 81 MG chewable tablet Chew 1 tablet (81 mg total) by mouth daily.   [DISCONTINUED] isosorbide mononitrate (IMDUR) 60 MG 24 hr tablet Take 1 tablet (60 mg total) by mouth daily.   [DISCONTINUED] metoprolol succinate (TOPROL-XL) 50 MG 24 hr tablet Take 1 tablet (50 mg total) by mouth daily. Please keep upcomming appt in January 2023 with Cardiologist before anymore refills. Thank you Final Attempt   [DISCONTINUED] nitroGLYCERIN (NITROSTAT) 0.4 MG SL tablet PLACE 1 TABLET UNDER THE TONGUE EVERY 5 MINUTES AS NEEDED FOR CHEST PAIN   [DISCONTINUED] rosuvastatin (CRESTOR) 40  MG tablet Take 1 tablet (40 mg total) by mouth daily.     Allergies:   Patient has no known allergies.   Social History   Socioeconomic History   Marital status: Married    Spouse name: Not on file   Number of children: 4   Years of education: 24   Highest education level: Not on file  Occupational History   Occupation: neurosurgeon    Comment: [employer]  Tobacco Use   Smoking status: Former    Packs/day: 0.50    Years: 8.00    Pack years: 4.00    Types: Cigarettes    Quit date: 03/04/1987    Years since quitting: 34.0   Smokeless tobacco: Never   Tobacco comments:     12/11/2011 "stopped smoking cigarettes ~ 25 yr ago"  Vaping Use   Vaping Use: Never used  Substance and Sexual Activity   Alcohol use: Yes    Alcohol/week: 15.0 standard drinks    Types: 15 Glasses of wine per week    Comment: wine with dinner 2-3    Drug use: No   Sexual activity: Yes    Partners: Female  Other Topics Concern   Not on file  Social History [place] - '72 - [place], [place]; surgical resident [place]; [place]. [place] '84. Married 10 years - divorced. Married '90 - ; 2 sons - ;'30, '02; 2 dtrs - '91, '96. Work - Engineer, technical sales. Marriage in good health. I son - ice skater/competitive, 1 son Chef in the [place] area.   Social Determinants of Health   Financial Resource Strain: Not on file  Food Insecurity: Not on file  Transportation Needs: Not on file  Physical Activity: Not on file  Stress: Not on file  Social Connections: Not on file     Family History: The patient's family history includes AAA (abdominal aortic aneurysm) in his father; Pancreatic cancer in his cousin and maternal aunt. There is no history of Diabetes, Hypertension, Colon cancer, Stomach cancer, or Throat cancer.  ROS:   Please see the history of present illness.    All other systems reviewed and are negative.  EKGs/Labs/Other Studies Reviewed:    The following studies were reviewed today: Coronary CT-FFR: FINDINGS: Non-cardiac: See separate report from [facility].   No LA appendage thrombus. Pulmonary veins drain normally to the left atrium.   Calcium Score: 533 Agatston units.   Coronary Arteries: Right dominant with no anomalies   LM: No plaque or stenosis.   LAD system: Mixed plaque in the proximal LAD with a napkin ring-type stenosis just proximal to take-off of D1. Possible severe (70-99%) stenosis. Moderate-sized D1 with moderate (51-69%) stenosis ostially.   Circumflex system: Calcified plaque mid LCx, minimal stenosis. Moderate PLOM with  possible moderate (51-69%) stenosis proximal vessel.   RCA system: Calcified plaque mid RCA, minimal stenosis. Calcified plaque distal RCA, minimal stenosis.   IMPRESSION: 1. Coronary calcium score 533 Agatston units. This places the patient in the 67th percentile for age and gender, suggesting intermediate risk for future cardiac events.   2. Possible severe napkin ring stenosis in the proximal LAD at D1 with ostial D1 stenosis.   3.  Concern for moerate stenosis proximal PLOM.   Will send for FFR.  1. Left Main: No significant stenosis.   2. LAD: 0.93 after proximal LAD stenosis.  0.93 mid D1. 3. LCX: 0.93 mid PLOM. 4. RCA: No significant stenosis.   IMPRESSION: The stenoses in the LAD,  D1, and PLOM do not appear to be hemodynamically significant.  EKG:  EKG is ordered today.  The ekg ordered today demonstrates normal sinus rhythm 66 bpm, right bundle branch block, otherwise within normal limits.  Recent Labs: 12/12/2020: BUN 18; Creatinine, Ser 0.87; Potassium 4.6; Sodium 142  Recent Lipid Panel    Component Value Date/Time   CHOL 156 02/02/2020 1602   TRIG 73 02/02/2020 1602   HDL 62 02/02/2020 1602   CHOLHDL 2.5 02/02/2020 1602   CHOLHDL 3 07/22/2017 1139   VLDL 13.2 07/22/2017 1139   LDLCALC 80 02/02/2020 1602   LDLDIRECT 156.5 12/01/2011 0958     Risk Assessment/Calculations:           Physical Exam:    VS:  BP 120/72    Pulse 66    Ht 5\' 8"  (1.727 m)    Wt 179 lb 6.4 oz (81.4 kg)    SpO2 95%    BMI 27.28 kg/m     Wt Readings from Last 3 Encounters:  03/06/21 179 lb 6.4 oz (81.4 kg)  12/12/20 176 lb (79.8 kg)  07/26/20 183 lb (83 kg)     GEN:  Well nourished, well developed in no acute distress HEENT: Normal NECK: No JVD; No carotid bruits LYMPHATICS: No lymphadenopathy CARDIAC: RRR, no murmurs, rubs, gallops RESPIRATORY:  Clear to auscultation without rales, wheezing or rhonchi  ABDOMEN: Soft, non-tender, non-distended MUSCULOSKELETAL:  No  edema; No deformity  SKIN: Warm and dry NEUROLOGIC:  Alert and oriented x 3 PSYCHIATRIC:  Normal affect   ASSESSMENT:    1. Coronary artery disease involving native coronary artery of native heart with angina pectoris (HCC)   2. Chest pain, unspecified type   3. Mixed hyperlipidemia    PLAN:    In order of problems listed above:  The patient will continue on aspirin for antiplatelet therapy, isosorbide and metoprolol, and rosuvastatin for lipid-lowering.  His CT FFR results are discussed with him and I think it is reasonable to continue with medical therapy.  However, if he develops recurrent angina I would be inclined to proceed with cardiac catheterization for definitive evaluation.  There is some uncertainty about the CTA results as there is a discrepancy between the visual stenosis and the normal FFR. As above, suspect anginal chest pain, seems to be better at present. Treated with a high intensity statin drug with rosuvastatin 40 mg.  Continue current therapy.     Medication Adjustments/Labs and Tests Ordered: Current medicines are reviewed at length with the patient today.  Concerns regarding medicines are outlined above.  Orders Placed This Encounter  Procedures   EKG 12-Lead   Meds ordered this encounter  Medications   isosorbide mononitrate (IMDUR) 60 MG 24 hr tablet    Sig: Take 1 tablet (60 mg total) by mouth daily.    Dispense:  90 tablet    Refill:  3   metoprolol succinate (TOPROL-XL) 50 MG 24 hr tablet    Sig: Take 1 tablet (50 mg total) by mouth daily.    Dispense:  90 tablet    Refill:  3   nitroGLYCERIN (NITROSTAT) 0.4 MG SL tablet    Sig: PLACE 1 TABLET UNDER THE TONGUE EVERY 5 MINUTES AS NEEDED FOR CHEST PAIN x 3 DOSES, THEN 911    Dispense:  25 tablet    Refill:  6   rosuvastatin (CRESTOR) 40 MG tablet    Sig: Take 1 tablet (40 mg total) by mouth daily.  Dispense:  90 tablet    Refill:  3    Patient Instructions     Signed, John Mocha, MD   03/06/2021 5:47 PM    [place]

## 2021-03-20 ENCOUNTER — Ambulatory Visit: Payer: Medicare Other | Admitting: Physician Assistant

## 2021-07-30 ENCOUNTER — Encounter: Payer: Self-pay | Admitting: Family Medicine

## 2021-07-30 ENCOUNTER — Ambulatory Visit (INDEPENDENT_AMBULATORY_CARE_PROVIDER_SITE_OTHER): Payer: Medicare Other | Admitting: Family Medicine

## 2021-07-30 VITALS — BP 130/84 | HR 69 | Temp 97.5°F | Ht 68.0 in | Wt 178.0 lb

## 2021-07-30 DIAGNOSIS — R7303 Prediabetes: Secondary | ICD-10-CM | POA: Diagnosis not present

## 2021-07-30 DIAGNOSIS — E785 Hyperlipidemia, unspecified: Secondary | ICD-10-CM | POA: Diagnosis not present

## 2021-07-30 DIAGNOSIS — I2510 Atherosclerotic heart disease of native coronary artery without angina pectoris: Secondary | ICD-10-CM

## 2021-07-30 DIAGNOSIS — I10 Essential (primary) hypertension: Secondary | ICD-10-CM | POA: Diagnosis not present

## 2021-07-30 LAB — CBC WITH DIFFERENTIAL/PLATELET
Basophils Absolute: 0 10*3/uL (ref 0.0–0.1)
Basophils Relative: 0.7 % (ref 0.0–3.0)
Eosinophils Absolute: 0.1 10*3/uL (ref 0.0–0.7)
Eosinophils Relative: 2.4 % (ref 0.0–5.0)
HCT: 40 % (ref 39.0–52.0)
Hemoglobin: 13.8 g/dL (ref 13.0–17.0)
Lymphocytes Relative: 30.6 % (ref 12.0–46.0)
Lymphs Abs: 1.3 10*3/uL (ref 0.7–4.0)
MCHC: 34.6 g/dL (ref 30.0–36.0)
MCV: 91.9 fl (ref 78.0–100.0)
Monocytes Absolute: 0.4 10*3/uL (ref 0.1–1.0)
Monocytes Relative: 8.6 % (ref 3.0–12.0)
Neutro Abs: 2.4 10*3/uL (ref 1.4–7.7)
Neutrophils Relative %: 57.7 % (ref 43.0–77.0)
Platelets: 165 10*3/uL (ref 150.0–400.0)
RBC: 4.35 Mil/uL (ref 4.22–5.81)
RDW: 13.7 % (ref 11.5–15.5)
WBC: 4.2 10*3/uL (ref 4.0–10.5)

## 2021-07-30 LAB — LIPID PANEL
Cholesterol: 154 mg/dL (ref 0–200)
HDL: 59.8 mg/dL (ref >39.00)
LDL Cholesterol: 55 mg/dL (ref 0–99)
NonHDL: 94.45
Total CHOL/HDL Ratio: 3
Triglycerides: 198 mg/dL — ABNORMAL HIGH (ref 0.0–149.0)
VLDL: 39.6 mg/dL (ref 0.0–40.0)

## 2021-07-30 LAB — COMPREHENSIVE METABOLIC PANEL
ALT: 19 U/L (ref 0–53)
AST: 19 U/L (ref 0–37)
Albumin: 4 g/dL (ref 3.5–5.2)
Alkaline Phosphatase: 55 U/L (ref 39–117)
BUN: 13 mg/dL (ref 6–23)
CO2: 30 mEq/L (ref 19–32)
Calcium: 8.9 mg/dL (ref 8.4–10.5)
Chloride: 106 mEq/L (ref 96–112)
Creatinine, Ser: 0.78 mg/dL (ref 0.40–1.50)
GFR: 87.5 mL/min (ref >60.00)
Glucose, Bld: 100 mg/dL — ABNORMAL HIGH (ref 70–99)
Potassium: 4.4 mEq/L (ref 3.5–5.1)
Sodium: 142 mEq/L (ref 135–145)
Total Bilirubin: 0.6 mg/dL (ref 0.2–1.2)
Total Protein: 6 g/dL (ref 6.0–8.3)

## 2021-07-30 LAB — HEMOGLOBIN A1C: Hgb A1c MFr Bld: 5.6 % (ref 4.6–6.5)

## 2021-07-30 LAB — TSH: TSH: 4.03 u[IU]/mL (ref 0.35–5.50)

## 2021-07-30 NOTE — Progress Notes (Signed)
Subjective:     Patient ID: John Stein, male    DOB: May 12, 1945, 76 y.o.   MRN: [id number]  Chief Complaint  Patient presents with   Transfer of Care    Having some right -sided abdominal pain that started 4 or 5 days ago due to previous ulcer Pain at base of neck that sometimes  radiates forward No food, had orange juice Discuss shingles vaccine    HPI TOC Dr. Rogers Blocker. Ugi for H pylori 2 yrs ago General check up. Some burning pain 3 wks afo RUQ-lasted few days.  Coffee made worse but milk helped.  No v/d.  Felt like prev PUD.  Tums helped.     CAD-no cp.  Active.  Sees Dr. Burt Knack HTN-140/80.   Occ HA from occiput to front. About every 6-8 wks.  No photo   Health Maintenance Due  Topic Date Due   Zoster Vaccines- Shingrix (1 of 2) Never done    Past Medical History:  Diagnosis Date   Basal cell carcinoma of dorsum of nose    CAD (coronary artery disease)    a. 12/11/2011 Ex MV: Med/Sev ant & antlat ischemia, EF 53%, 48m ST dep V4-V6;  b. 12/11/2011 Cath/PTCA: LM nl, LAD 20-371mD1 99ost (Tx w/ PTCA only), LCX 40/6016mCA diff nonobs dzs, EF 55%, sublte dist antlat HK.     History of nephrolithiasis    History of smallpox childhood   HTN (hypertension)    Hyperlipidemia    Kidney stone ~ 2003   Shingles rash 2011   right scalp    Past Surgical History:  Procedure Laterality Date   CORONARY ANGIOPLASTY  12/11/2011   LEFT HEART CATHETERIZATION WITH CORONARY ANGIOGRAM N/A 12/11/2011   Procedure: LEFT HEART CATHETERIZATION WITH CORONARY ANGIOGRAM;  Surgeon: MicSherren MochaD;  [facility];  Service: Cardiovascular;  Laterality: N/A;   [id number]    Outpatient Medications Prior to Visit  Medication Sig Dispense Refill   acetaminophen (TYLENOL) 325 MG tablet Take 650 mg by mouth every 6 (six) hours as needed.     aspirin 81 MG chewable tablet Chew 1 tablet (81 mg total) by mouth daily.     isosorbide  mononitrate (IMDUR) 60 MG 24 hr tablet Take 1 tablet (60 mg total) by mouth daily. 90 tablet 3   metoprolol succinate (TOPROL-XL) 50 MG 24 hr tablet Take 1 tablet (50 mg total) by mouth daily. 90 tablet 3   nitroGLYCERIN (NITROSTAT) 0.4 MG SL tablet PLACE 1 TABLET UNDER THE TONGUE EVERY 5 MINUTES AS NEEDED FOR CHEST PAIN x 3 DOSES, THEN 911 25 tablet 6   rosuvastatin (CRESTOR) 40 MG tablet Take 1 tablet (40 mg total) by mouth daily. 90 tablet 3   No facility-administered medications prior to visit.    No Known Allergies ROS  ROS: Gen: no fever, chills  Skin: no rash, itching ENT: no ear pain, ear drainage, nasal congestion, rhinorrhea, sinus pressure, sore throat Eyes: no blurry vision, double vision Resp: no cough, wheeze,SOB CV: no CP, palpitations, LE edema,  GI: no heartburn, n/v/d/c, abd pain GU: no dysuria, urgency, frequency, hematuria MSK: MTP, CMP.  arthritis Neuro: no dizziness, weakness, vertigo Psych: no depression, anxiety, insomnia, SI     Objective:     BP 130/84   Pulse 69   Temp (!) 97.5 F (36.4 C) (Temporal)   Ht '5\' 8"'$  (1.727 m)   Wt 178 lb (80.7 kg)  SpO2 97%   BMI 27.06 kg/m  Wt Readings from Last 3 Encounters:  07/30/21 178 lb (80.7 kg)  03/06/21 179 lb 6.4 oz (81.4 kg)  12/12/20 176 lb (79.8 kg)    Physical Exam   Gen: WDWN NAD HEENT: NCAT, conjunctiva not injected, sclera nonicteric NECK:  supple, no thyromegaly, no nodes, no carotid bruits.  ?mild enlargement L cerv vertebral facet. CARDIAC: RRR, S1S2+, no murmur. DP 2+B LUNGS: CTAB. No wheezes ABDOMEN:  BS+, soft, NTND, No HSM, no masses EXT:  no edema MSK: no gross abnormalities. Some arthritis in hands NEURO: A&O x3.  CN II-XII intact.  PSYCH: normal mood. Good eye contact     Assessment & Plan:   Problem List Items Addressed This Visit       Cardiovascular and Mediastinum   Coronary atherosclerosis of native coronary artery - Primary   HTN (hypertension)     Other    Hyperlipidemia with target low density lipoprotein (LDL) cholesterol less than 70 mg/dL   Prediabetes   CAD-chronic.  Stable on imdur, metoprolol, rosuvastatin-followed by card.  Check lipids, cbc,cmp HLD-chronic   controlled on rosuvastatin.  Cont.  Check lft's, lipds, tsh HTN-chronic. controlled on metoprolol-cont.  ? If truly HTN or on meds for CAD.  Check CBC preDM-in past.  Working on TLC.  Will check A1C.tsh RUQ burning-?GERD, other.  Resolved.  Avoid triggers.  Monitor.  If returns, Ppi for 2 wks.   No orders of the defined types were placed in this encounter.   Wellington Hampshire, MD

## 2021-07-30 NOTE — Patient Instructions (Signed)
It was very nice to see you today!  Keep active   PLEASE NOTE:  If you had any lab tests please let us know if you have not heard back within a few days. You may see your results on MyChart before we have a chance to review them but we will give you a call once they are reviewed by us. If we ordered any referrals today, please let us know if you have not heard from their office within the next week.   Please try these tips to maintain a healthy lifestyle:  Eat most of your calories during the day when you are active. Eliminate processed foods including packaged sweets (pies, cakes, cookies), reduce intake of potatoes, white bread, white pasta, and white rice. Look for whole grain options, oat flour or almond flour.  Each meal should contain half fruits/vegetables, one quarter protein, and one quarter carbs (no bigger than a computer mouse).  Cut down on sweet beverages. This includes juice, soda, and sweet tea. Also watch fruit intake, though this is a healthier sweet option, it still contains natural sugar! Limit to 3 servings daily.  Drink at least 1 glass of water with each meal and aim for at least 8 glasses per day  Exercise at least 150 minutes every week.   

## 2021-08-05 ENCOUNTER — Encounter: Payer: Self-pay | Admitting: Cardiovascular Disease

## 2021-08-05 ENCOUNTER — Other Ambulatory Visit: Payer: Self-pay

## 2021-08-05 ENCOUNTER — Ambulatory Visit (INDEPENDENT_AMBULATORY_CARE_PROVIDER_SITE_OTHER): Payer: Medicare Other | Admitting: Cardiovascular Disease

## 2021-08-05 VITALS — BP 134/72 | HR 63 | Ht 68.0 in | Wt 174.0 lb

## 2021-08-05 DIAGNOSIS — I10 Essential (primary) hypertension: Secondary | ICD-10-CM | POA: Diagnosis not present

## 2021-08-05 DIAGNOSIS — I25119 Atherosclerotic heart disease of native coronary artery with unspecified angina pectoris: Secondary | ICD-10-CM

## 2021-08-05 DIAGNOSIS — I2510 Atherosclerotic heart disease of native coronary artery without angina pectoris: Secondary | ICD-10-CM

## 2021-08-05 DIAGNOSIS — Z79899 Other long term (current) drug therapy: Secondary | ICD-10-CM

## 2021-08-05 DIAGNOSIS — E782 Mixed hyperlipidemia: Secondary | ICD-10-CM

## 2021-08-05 MED ORDER — ICOSAPENT ETHYL 1 G PO CAPS: 2.0000 g | ORAL_CAPSULE | Freq: Two times a day (BID) | ORAL | 11 refills | Status: DC

## 2021-08-05 NOTE — Patient Instructions (Signed)
Medication Instructions:  START Vascepa 2gm twice daily *If you need a refill on your cardiac medications before your next appointment, please call your pharmacy*   Lab Work: Lipids, LFT prior to next appt in 6 months  If you have labs (blood work) drawn today and your tests are completely normal, you will receive your results only by: [place] (if you have MyChart) OR A paper copy in the mail If you have any lab test that is abnormal or we need to change your treatment, we will call you to review the results.   Testing/Procedures: NONE   Follow-Up: At [facility], you and your health needs are our priority.  As part of our continuing mission to provide you with exceptional heart care, we have created designated Provider Care Teams.  These Care Teams include your primary Cardiologist (physician) and Advanced Practice Providers (APPs -  Physician Assistants and Nurse Practitioners) who all work together to provide you with the care you need, when you need it.  Your next appointment:   6 month(s)  The format for your next appointment:   In Person  Provider:   Sherren Mocha, MD       Important Information About Sugar

## 2021-08-05 NOTE — Progress Notes (Unsigned)
Cardiology Office Note:    Date:  08/05/2021   ID:  John Stein, DOB [birth year], MRN [id number]  PCP:  Tawnya Crook, MD   [place] [facility] Cardiologist:  Sherren Mocha, MD     Referring MD: No ref. provider found   Chief Complaint  Patient presents with   Coronary Artery Disease    History of Present Illness:    John Stein is a 76 y.o. male with a hx of coronary artery disease, presenting for follow-up evaluation.  The patient presented with anginal chest pain in 2013 and underwent cardiac catheterization demonstrating critical stenosis of a large first diagonal branch.  He was noted to have nonobstructive disease elsewhere.  Because of the ostial location of the lesion, he underwent Cutting Balloon angioplasty alone with a good angiographic result.  He has been managed medically since that time on a combination of isosorbide and metoprolol for antianginal therapy.  He has been able to tolerate a statin drug.   The patient is here alone today.  He has been doing very well.  He denies chest pain, shortness of breath, edema, or heart palpitations.  He has been traveling and is quite active.  He denies any exertional symptoms.  The patient is compliant with his medication.  He carries nitroglycerin but has not had to take any.  Past Medical History:  Diagnosis Date   Basal cell carcinoma of dorsum of nose    CAD (coronary artery disease)    a. 12/11/2011 Ex MV: Med/Sev ant & antlat ischemia, EF 53%, 25m ST dep V4-V6;  b. 12/11/2011 Cath/PTCA: LM nl, LAD 20-351mD1 99ost (Tx w/ PTCA only), LCX 40/6040mCA diff nonobs dzs, EF 55%, sublte dist antlat HK.     History of nephrolithiasis    History of smallpox childhood   HTN (hypertension)    Hyperlipidemia    Kidney stone ~ 2003   Shingles rash 2011   right scalp    Past Surgical History:  Procedure Laterality Date   CORONARY ANGIOPLASTY  12/11/2011   LEFT HEART CATHETERIZATION WITH CORONARY ANGIOGRAM N/A  12/11/2011   Procedure: LEFT HEART CATHETERIZATION WITH CORONARY ANGIOGRAM;  Surgeon: MicSherren MochaD;  [facility];  Service: Cardiovascular;  Laterality: N/A;   [id number]    Current Medications: Current Meds  Medication Sig   acetaminophen (TYLENOL) 325 MG tablet Take 650 mg by mouth every 6 (six) hours as needed.   aspirin 81 MG chewable tablet Chew 1 tablet (81 mg total) by mouth daily.   icosapent Ethyl (VASCEPA) 1 g capsule Take 2 capsules (2 g total) by mouth 2 (two) times daily.   isosorbide mononitrate (IMDUR) 60 MG 24 hr tablet Take 1 tablet (60 mg total) by mouth daily.   metoprolol succinate (TOPROL-XL) 50 MG 24 hr tablet Take 1 tablet (50 mg total) by mouth daily.   nitroGLYCERIN (NITROSTAT) 0.4 MG SL tablet PLACE 1 TABLET UNDER THE TONGUE EVERY 5 MINUTES AS NEEDED FOR CHEST PAIN x 3 DOSES, THEN 911   rosuvastatin (CRESTOR) 40 MG tablet Take 1 tablet (40 mg total) by mouth daily.     Allergies:   Patient has no known allergies.   Social History   Socioeconomic History   Marital status: Married    Spouse name: Not on file   Number of children: 4   Years of education: 24   Highest education level: Not on file  Occupational History  Occupation: neurosurgeon    Comment: [employer]  Tobacco Use   Smoking status: Former    Packs/day: 0.50    Years: 8.00    Pack years: 4.00    Types: Cigarettes    Quit date: 03/04/1987    Years since quitting: 34.4   Smokeless tobacco: Never   Tobacco comments:    12/11/2011 "stopped smoking cigarettes ~ 25 yr ago"  Vaping Use   Vaping Use: Never used  Substance and Sexual Activity   Alcohol use: Yes    Alcohol/week: 15.0 standard drinks    Types: 15 Glasses of wine per week    Comment: wine with dinner 1-2   Drug use: No   Sexual activity: Yes    Partners: Female  Other Topics Concern   Not on file  Social History [place] - '72 - [place], [place];  surgical resident [place]; [place]. [place] '84. Married 10 years - divorced. Married '90 - ; 2 sons - ;'36, '02; 2 dtrs - '91, '96. Work - Engineer, technical sales. Marriage in good health. I son - ice skater/competitive, 1 son Chef in the [place] area.   Grands 3   Social Determinants of Radio broadcast assistant Strain: Not on file  Food Insecurity: Not on file  Transportation Needs: Not on file  Physical Activity: Not on file  Stress: Not on file  Social Connections: Not on file     Family History: The patient's family history includes AAA (abdominal aortic aneurysm) in his father; Pancreatic cancer in his cousin and maternal aunt. There is no history of Diabetes, Hypertension, Colon cancer, Stomach cancer, or Throat cancer.  ROS:   Please see the history of present illness.    All other systems reviewed and are negative.  EKGs/Labs/Other Studies Reviewed:    The following studies were reviewed today: Stress Test 2017: Nuclear stress EF: 49%. Blood pressure demonstrated a hypertensive response to exercise. There was no ST segment deviation noted during stress. This is a low risk study. The left ventricular ejection fraction is mildly decreased (45-54%).   Normal resting and stress perfusion. No ischemia or infarction EF 49% but visually looks normal. HTN response to exercise   EKG:  EKG is not ordered today.    Recent Labs: 07/30/2021: ALT 19; BUN 13; Creatinine, Ser 0.78; Hemoglobin 13.8; Platelets 165.0; Potassium 4.4; Sodium 142; TSH 4.03  Recent Lipid Panel    Component Value Date/Time   CHOL 154 07/30/2021 1109   CHOL 156 02/02/2020 1602   TRIG 198.0 (H) 07/30/2021 1109   HDL 59.80 07/30/2021 1109   HDL 62 02/02/2020 1602   CHOLHDL 3 07/30/2021 1109   VLDL 39.6 07/30/2021 1109   LDLCALC 55 07/30/2021 1109   LDLCALC 80 02/02/2020 1602   LDLDIRECT 156.5 12/01/2011 0958     Risk Assessment/Calculations:           Physical Exam:    VS:  BP 134/72    Pulse 63   Ht '5\' 8"'$  (1.727 m)   Wt 174 lb (78.9 kg)   SpO2 95%   BMI 26.46 kg/m     Wt Readings from Last 3 Encounters:  08/05/21 174 lb (78.9 kg)  07/30/21 178 lb (80.7 kg)  03/06/21 179 lb 6.4 oz (81.4 kg)     GEN:  Well nourished, well developed in no acute distress HEENT: Normal NECK: No JVD; No carotid bruits LYMPHATICS: No lymphadenopathy CARDIAC: RRR, no murmurs, rubs, gallops RESPIRATORY:  Clear to auscultation  without rales, wheezing or rhonchi  ABDOMEN: Soft, non-tender, non-distended MUSCULOSKELETAL:  No edema; No deformity  SKIN: Warm and dry NEUROLOGIC:  Alert and oriented x 3 PSYCHIATRIC:  Normal affect   ASSESSMENT:    1. Medication management   2. Coronary artery disease involving native coronary artery of native heart with angina pectoris ([place])   3. Mixed hyperlipidemia   4. Essential hypertension    PLAN:    In order of problems listed above:  The patient is stable on his current medical regimen.  Antianginal therapy includes isosorbide and metoprolol.  He was treated with a high intensity statin drug.  He remains on aspirin for antiplatelet therapy.  I will see him back in 6 months for follow-up evaluation. Recent labs reviewed.  LDL at goal of 55 mg/dL.  Hypertriglyceridemia noted.  Recommend Vascepa addition to his medical regimen.  Repeat lipids and LFTs prior to his return office visit in 6 months. Blood pressure is well controlled on current medical program.  No changes are made today to his antihypertensive regimen.           Medication Adjustments/Labs and Tests Ordered: Current medicines are reviewed at length with the patient today.  Concerns regarding medicines are outlined above.  Orders Placed This Encounter  Procedures   Hepatic function panel   Lipid panel   Meds ordered this encounter  Medications   icosapent Ethyl (VASCEPA) 1 g capsule    Sig: Take 2 capsules (2 g total) by mouth 2 (two) times daily.    Dispense:  120 capsule     Refill:  11    Patient Instructions  Medication Instructions:  START Vascepa 2gm twice daily *If you need a refill on your cardiac medications before your next appointment, please call your pharmacy*   Lab Work: Lipids, LFT prior to next appt in 6 months  If you have labs (blood work) drawn today and your tests are completely normal, you will receive your results only by: [place] (if you have MyChart) OR A paper copy in the mail If you have any lab test that is abnormal or we need to change your treatment, we will call you to review the results.   Testing/Procedures: NONE   Follow-Up: At [facility], you and your health needs are our priority.  As part of our continuing mission to provide you with exceptional heart care, we have created designated Provider Care Teams.  These Care Teams include your primary Cardiologist (physician) and Advanced Practice Providers (APPs -  Physician Assistants and Nurse Practitioners) who all work together to provide you with the care you need, when you need it.  Your next appointment:   6 month(s)  The format for your next appointment:   In Person  Provider:   Sherren Mocha, MD       Important Information About Sugar         Signed, Sherren Mocha, MD  08/05/2021 8:33 AM    [place]

## 2021-08-13 ENCOUNTER — Other Ambulatory Visit: Payer: Self-pay

## 2021-08-13 MED ORDER — NITROGLYCERIN 0.4 MG SL SUBL: SUBLINGUAL_TABLET | SUBLINGUAL | 2 refills | Status: DC

## 2021-08-13 MED ORDER — ROSUVASTATIN CALCIUM 40 MG PO TABS: 40.0000 mg | ORAL_TABLET | Freq: Every day | ORAL | 3 refills | Status: DC

## 2021-08-13 MED ORDER — ISOSORBIDE MONONITRATE ER 60 MG PO TB24: 60.0000 mg | ORAL_TABLET | Freq: Every day | ORAL | 3 refills | Status: DC

## 2021-08-13 MED ORDER — METOPROLOL SUCCINATE ER 50 MG PO TB24: 50.0000 mg | ORAL_TABLET | Freq: Every day | ORAL | 3 refills | Status: DC

## 2021-08-13 NOTE — Addendum Note (Signed)
Addended by: Carter Kitten D on: 08/13/2021 09:41 AM   Modules accepted: Orders

## 2021-08-13 NOTE — Addendum Note (Signed)
Addended by: Carter Kitten D on: 08/13/2021 09:43 AM   Modules accepted: Orders

## 2021-09-04 ENCOUNTER — Ambulatory Visit: Payer: Medicare Other | Admitting: Cardiovascular Disease

## 2021-11-25 ENCOUNTER — Encounter: Payer: Self-pay | Admitting: *Deleted

## 2022-02-10 ENCOUNTER — Other Ambulatory Visit: Payer: PRIVATE HEALTH INSURANCE

## 2022-02-12 ENCOUNTER — Ambulatory Visit: Payer: PRIVATE HEALTH INSURANCE | Admitting: Cardiovascular Disease

## 2022-02-13 ENCOUNTER — Encounter: Payer: Self-pay | Admitting: *Deleted

## 2022-02-21 ENCOUNTER — Encounter: Payer: Self-pay | Admitting: Cardiovascular Disease

## 2022-02-21 ENCOUNTER — Ambulatory Visit: Payer: Medicare Other | Attending: Cardiovascular Disease | Admitting: Cardiovascular Disease

## 2022-02-21 VITALS — BP 120/72 | HR 53 | Ht 68.0 in | Wt 175.6 lb

## 2022-02-21 DIAGNOSIS — E782 Mixed hyperlipidemia: Secondary | ICD-10-CM | POA: Insufficient documentation

## 2022-02-21 DIAGNOSIS — I10 Essential (primary) hypertension: Secondary | ICD-10-CM | POA: Insufficient documentation

## 2022-02-21 DIAGNOSIS — I2510 Atherosclerotic heart disease of native coronary artery without angina pectoris: Secondary | ICD-10-CM | POA: Diagnosis not present

## 2022-02-21 DIAGNOSIS — I25119 Atherosclerotic heart disease of native coronary artery with unspecified angina pectoris: Secondary | ICD-10-CM | POA: Insufficient documentation

## 2022-02-21 NOTE — Patient Instructions (Signed)
Medication Instructions:  Your physician recommends that you continue on your current medications as directed. Please refer to the Current Medication list given to you today.  *If you need a refill on your cardiac medications before your next appointment, please call your pharmacy*   Lab Work: Lipids, Liver today If you have labs (blood work) drawn today and your tests are completely normal, you will receive your results only by: [place] (if you have MyChart) OR A paper copy in the mail If you have any lab test that is abnormal or we need to change your treatment, we will call you to review the results.   Testing/Procedures: NONE   Follow-Up: At [facility], you and your health needs are our priority.  As part of our continuing mission to provide you with exceptional heart care, we have created designated Provider Care Teams.  These Care Teams include your primary Cardiologist (physician) and Advanced Practice Providers (APPs -  Physician Assistants and Nurse Practitioners) who all work together to provide you with the care you need, when you need it.  We recommend signing up for the patient portal called "MyChart".  Sign up information is provided on this After Visit Summary.  MyChart is used to connect with patients for Virtual Visits (Telemedicine).  Patients are able to view lab/test results, encounter notes, upcoming appointments, etc.  Non-urgent messages can be sent to your provider as well.   To learn more about what you can do with MyChart, go to [email or web address].    Your next appointment:   1 year(s)  The format for your next appointment:   In Person  Provider:   Sherren Mocha, MD       Important Information About Sugar

## 2022-02-21 NOTE — Progress Notes (Signed)
Cardiology Office Note:    Date:  02/21/2022   ID:  Olga Seyler, DOB 09-18-1945, MRN [id number]  PCP:  Tawnya Crook, [place] Prov[facility] Cardiologist:  Sherren Mocha, MD     Referring MD: Tawnya Crook, MD   Chief Complaint  Patient presents with   Coronary Artery Disease    History of Present Illness:    John Stein is a 76 y.o. male with a hx of coronary artery disease, presenting for follow-up evaluation. The patient presented with anginal chest pain in 2013 and underwent cardiac catheterization demonstrating critical stenosis of a large first diagonal branch.  He was noted to have nonobstructive disease elsewhere.  Because of the ostial location of the lesion, he underwent Cutting Balloon angioplasty alone with a good angiographic result.  He has been managed medically since that time on a combination of isosorbide and metoprolol for antianginal therapy.  He has been able to tolerate a statin drug.  When the patient was last seen in June 2023 he was doing very well.  He denied any symptoms of angina.  The patient is here alone today.  He has been doing pretty well except for some dizziness.  States that there is no postural component to this.  No presyncope or syncope.  He has had no recent symptoms of chest pain, chest pressure, or shortness of breath.  He has been physically active building a house in [place] and he has no exertional symptoms.  Past Medical History:  Diagnosis Date   Basal cell carcinoma of dorsum of nose    CAD (coronary artery disease)    a. 12/11/2011 Ex MV: Med/Sev ant & antlat ischemia, EF 53%, 59m ST dep V4-V6;  b. 12/11/2011 Cath/PTCA: LM nl, LAD 20-374mD1 99ost (Tx w/ PTCA only), LCX 40/6077mCA diff nonobs dzs, EF 55%, sublte dist antlat HK.     History of nephrolithiasis    History of smallpox childhood   HTN (hypertension)    Hyperlipidemia    Kidney stone ~ 2003   Shingles rash 2011   right scalp    Past  Surgical History:  Procedure Laterality Date   CORONARY ANGIOPLASTY  12/11/2011   LEFT HEART CATHETERIZATION WITH CORONARY ANGIOGRAM N/A 12/11/2011   Procedure: LEFT HEART CATHETERIZATION WITH CORONARY ANGIOGRAM;  Surgeon: MicSherren MochaD;  [facility];  Service: Cardiovascular;  Laterality: N/A;   [id number]    Current Medications: Current Meds  Medication Sig   acetaminophen (TYLENOL) 325 MG tablet Take 650 mg by mouth every 6 (six) hours as needed.   aspirin 81 MG chewable tablet Chew 1 tablet (81 mg total) by mouth daily.   icosapent Ethyl (VASCEPA) 1 g capsule Take 2 capsules (2 g total) by mouth 2 (two) times daily.   isosorbide mononitrate (IMDUR) 60 MG 24 hr tablet Take 1 tablet (60 mg total) by mouth daily.   metoprolol succinate (TOPROL-XL) 50 MG 24 hr tablet Take 1 tablet (50 mg total) by mouth daily.   nitroGLYCERIN (NITROSTAT) 0.4 MG SL tablet PLACE 1 TABLET UNDER THE TONGUE EVERY 5 MINUTES AS NEEDED FOR CHEST PAIN x 3 DOSES, THEN 911   rosuvastatin (CRESTOR) 40 MG tablet Take 1 tablet (40 mg total) by mouth daily.     Allergies:   Patient has no known allergies.   Social History   Socioeconomic History   Marital status: Married    Spouse name:  Not on file   Number of children: 4   Years of education: 24   Highest education level: Not on file  Occupational History   Occupation: neurosurgeon    Comment: [employer]  Tobacco Use   Smoking status: Former    Packs/day: 0.50    Years: 8.00    Total pack years: 4.00    Types: Cigarettes    Quit date: 03/04/1987    Years since quitting: 34.9   Smokeless tobacco: Never   Tobacco comments:    12/11/2011 "stopped smoking cigarettes ~ 25 yr ago"  Vaping Use   Vaping Use: Never used  Substance and Sexual Activity   Alcohol use: Yes    Alcohol/week: 15.0 standard drinks of alcohol    Types: 15 Glasses of wine per week    Comment: wine with dinner 1-2   Drug use: No    Sexual activity: Yes    Partners: Female  Other Topics Concern   Not on file  Social History [place] - '72 - [place], [place]; surgical resident [place]; [place]. [place] '84. Married 10 years - divorced. Married '90 - ; 2 sons - ;'58, '02; 2 dtrs - '91, '96. Work - Engineer, technical sales. Marriage in good health. I son - ice skater/competitive, 1 son Chef in the [place] area.   Grands 3   Social Determinants of Radio broadcast assistant Strain: Not on file  Food Insecurity: Not on file  Transportation Needs: Not on file  Physical Activity: Not on file  Stress: Not on file  Social Connections: Not on file     Family History: The patient's family history includes AAA (abdominal aortic aneurysm) in his father; Pancreatic cancer in his cousin and maternal aunt. There is no history of Diabetes, Hypertension, Colon cancer, Stomach cancer, or Throat cancer.  ROS:   Please see the history of present illness.    All other systems reviewed and are negative.  EKGs/Labs/Other Studies Reviewed:    The following studies were reviewed today: CT-FFR 12-31-2020: 1. Left Main: No significant stenosis.   2. LAD: 0.93 after proximal LAD stenosis.  0.93 mid D1. 3. LCX: 0.93 mid PLOM. 4. RCA: No significant stenosis.   IMPRESSION: The stenoses in the LAD, D1, and PLOM do not appear to be hemodynamically significant.  Coronary CTA: FINDINGS: Non-cardiac: See separate report from [facility].   No LA appendage thrombus. Pulmonary veins drain normally to the left atrium.   Calcium Score: 533 Agatston units.   Coronary Arteries: Right dominant with no anomalies   LM: No plaque or stenosis.   LAD system: Mixed plaque in the proximal LAD with a napkin ring-type stenosis just proximal to take-off of D1. Possible severe (70-99%) stenosis. Moderate-sized D1 with moderate (51-69%) stenosis ostially.   Circumflex system: Calcified plaque mid LCx, minimal  stenosis. Moderate PLOM with possible moderate (51-69%) stenosis proximal vessel.   RCA system: Calcified plaque mid RCA, minimal stenosis. Calcified plaque distal RCA, minimal stenosis.   IMPRESSION: 1. Coronary calcium score 533 Agatston units. This places the patient in the 67th percentile for age and gender, suggesting intermediate risk for future cardiac events.   2. Possible severe napkin ring stenosis in the proximal LAD at D1 with ostial D1 stenosis.   3.  Concern for moerate stenosis proximal PLOM.   Will send for FFR.    EKG:  EKG is ordered today.  The ekg ordered today demonstrates sinus bradycardia 53 bpm, right bundle branch block,  no change from previous  Recent Labs: 07/30/2021: ALT 19; BUN 13; Creatinine, Ser 0.78; Hemoglobin 13.8; Platelets 165.0; Potassium 4.4; Sodium 142; TSH 4.03  Recent Lipid Panel    Component Value Date/Time   CHOL 154 07/30/2021 1109   CHOL 156 02/02/2020 1602   TRIG 198.0 (H) 07/30/2021 1109   HDL 59.80 07/30/2021 1109   HDL 62 02/02/2020 1602   CHOLHDL 3 07/30/2021 1109   VLDL 39.6 07/30/2021 1109   LDLCALC 55 07/30/2021 1109   LDLCALC 80 02/02/2020 1602   LDLDIRECT 156.5 12/01/2011 0958     Risk Assessment/Calculations:                Physical Exam:    VS:  BP 120/72   Pulse (!) 53   Ht '5\' 8"'$  (1.727 m)   Wt 175 lb 9.6 oz (79.7 kg)   SpO2 98%   BMI 26.70 kg/m     Wt Readings from Last 3 Encounters:  02/21/22 175 lb 9.6 oz (79.7 kg)  08/05/21 174 lb (78.9 kg)  07/30/21 178 lb (80.7 kg)     GEN:  Well nourished, well developed in no acute distress HEENT: Normal NECK: No JVD; No carotid bruits LYMPHATICS: No lymphadenopathy CARDIAC: RRR, no murmurs, rubs, gallops RESPIRATORY:  Clear to auscultation without rales, wheezing or rhonchi  ABDOMEN: Soft, non-tender, non-distended MUSCULOSKELETAL:  No edema; No deformity  SKIN: Warm and dry NEUROLOGIC:  Alert and oriented x 3 PSYCHIATRIC:  Normal affect    ASSESSMENT:    1. Coronary artery disease involving native coronary artery of native heart with angina pectoris ([place])   2. Mixed hyperlipidemia   3. Essential hypertension    PLAN:    In order of problems listed above:  The patient is stable with no symptoms of angina.  He continues on aspirin for antiplatelet therapy, isosorbide and metoprolol for antianginal therapy, and high intensity statin drug.  I will see him back in 1 year for follow-up evaluation. He had hypertriglyceridemia, but this seems to have improved significantly.  He has been out of Vascepa and had some labs done in [place] that showed a triglyceride level of about 50.  We are going to repeat his labs today just to confirm that he no longer needs triglyceride lowering.  He will remain on rosuvastatin 40 mg daily.  Checking fasting lipids and LFTs today. Blood pressure is well-controlled on isosorbide and metoprolol succinate.     Medication Adjustments/Labs and Tests Ordered: Current medicines are reviewed at length with the patient today.  Concerns regarding medicines are outlined above.  Orders Placed This Encounter  Procedures   Lipid panel   Hepatic function panel   EKG 12-Lead   No orders of the defined types were placed in this encounter.   Patient Instructions  Medication Instructions:  Your physician recommends that you continue on your current medications as directed. Please refer to the Current Medication list given to you today.  *If you need a refill on your cardiac medications before your next appointment, please call your pharmacy*   Lab Work: Lipids, Liver today If you have labs (blood work) drawn today and your tests are completely normal, you will receive your results only by: [place] (if you have MyChart) OR A paper copy in the mail If you have any lab test that is abnormal or we need to change your treatment, we will call you to review the  results.   Testing/Procedures: NONE   Follow-Up: At [facility], you and your health needs are our priority.  As part of our continuing mission to provide you with exceptional heart care, we have created designated Provider Care Teams.  These Care Teams include your primary Cardiologist (physician) and Advanced Practice Providers (APPs -  Physician Assistants and Nurse Practitioners) who all work together to provide you with the care you need, when you need it.  We recommend signing up for the patient portal called "MyChart".  Sign up information is provided on this After Visit Summary.  MyChart is used to connect with patients for Virtual Visits (Telemedicine).  Patients are able to view lab/test results, encounter notes, upcoming appointments, etc.  Non-urgent messages can be sent to your provider as well.   To learn more about what you can do with MyChart, go to [email or web address].    Your next appointment:   1 year(s)  The format for your next appointment:   In Person  Provider:   Sherren Mocha, MD       Important Information About Sugar         Signed, Sherren Mocha, MD  02/21/2022 3:46 PM    [place]

## 2022-02-22 LAB — HEPATIC FUNCTION PANEL
ALT: 21 IU/L (ref 0–44)
AST: 25 IU/L (ref 0–40)
Albumin: 4.5 g/dL (ref 3.8–4.8)
Alkaline Phosphatase: 64 IU/L (ref 44–121)
Bilirubin Total: 0.7 mg/dL (ref 0.0–1.2)
Bilirubin, Direct: 0.27 mg/dL (ref 0.00–0.40)
Total Protein: 6.7 g/dL (ref 6.0–8.5)

## 2022-02-22 LAB — LIPID PANEL
Chol/HDL Ratio: 2.7 ratio (ref 0.0–5.0)
Cholesterol, Total: 147 mg/dL (ref 100–199)
HDL: 55 mg/dL (ref >39)
LDL Chol Calc (NIH): 75 mg/dL (ref 0–99)
Triglycerides: 89 mg/dL (ref 0–149)
VLDL Cholesterol Cal: 17 mg/dL (ref 5–40)

## 2022-03-06 ENCOUNTER — Other Ambulatory Visit: Payer: PRIVATE HEALTH INSURANCE

## 2022-03-27 ENCOUNTER — Ambulatory Visit: Payer: PRIVATE HEALTH INSURANCE | Admitting: Cardiovascular Disease

## 2022-07-07 ENCOUNTER — Ambulatory Visit: Payer: Medicare Other | Admitting: Family Medicine

## 2022-07-07 ENCOUNTER — Encounter: Payer: Self-pay | Admitting: Family Medicine

## 2022-07-07 VITALS — BP 120/74 | HR 81 | Temp 98.4°F | Resp 18 | Ht 68.0 in | Wt 176.2 lb

## 2022-07-07 DIAGNOSIS — R7303 Prediabetes: Secondary | ICD-10-CM

## 2022-07-07 DIAGNOSIS — G43109 Migraine with aura, not intractable, without status migrainosus: Secondary | ICD-10-CM | POA: Diagnosis not present

## 2022-07-07 DIAGNOSIS — E785 Hyperlipidemia, unspecified: Secondary | ICD-10-CM

## 2022-07-07 DIAGNOSIS — I2510 Atherosclerotic heart disease of native coronary artery without angina pectoris: Secondary | ICD-10-CM | POA: Diagnosis not present

## 2022-07-07 LAB — COMPREHENSIVE METABOLIC PANEL
ALT: 18 U/L (ref 0–53)
AST: 19 U/L (ref 0–37)
Albumin: 4 g/dL (ref 3.5–5.2)
Alkaline Phosphatase: 66 U/L (ref 39–117)
BUN: 17 mg/dL (ref 6–23)
CO2: 30 mEq/L (ref 19–32)
Calcium: 9.4 mg/dL (ref 8.4–10.5)
Chloride: 103 mEq/L (ref 96–112)
Creatinine, Ser: 0.85 mg/dL (ref 0.40–1.50)
GFR: 84.7 mL/min (ref >60.00)
Glucose, Bld: 101 mg/dL — ABNORMAL HIGH (ref 70–99)
Potassium: 4.5 mEq/L (ref 3.5–5.1)
Sodium: 141 mEq/L (ref 135–145)
Total Bilirubin: 0.8 mg/dL (ref 0.2–1.2)
Total Protein: 6.1 g/dL (ref 6.0–8.3)

## 2022-07-07 LAB — LIPID PANEL
Cholesterol: 159 mg/dL (ref 0–200)
HDL: 56.6 mg/dL (ref >39.00)
LDL Cholesterol: 86 mg/dL (ref 0–99)
NonHDL: 102.34
Total CHOL/HDL Ratio: 3
Triglycerides: 84 mg/dL (ref 0.0–149.0)
VLDL: 16.8 mg/dL (ref 0.0–40.0)

## 2022-07-07 LAB — CBC WITH DIFFERENTIAL/PLATELET
Basophils Absolute: 0 10*3/uL (ref 0.0–0.1)
Basophils Relative: 0.6 % (ref 0.0–3.0)
Eosinophils Absolute: 0.1 10*3/uL (ref 0.0–0.7)
Eosinophils Relative: 2.9 % (ref 0.0–5.0)
HCT: 42.8 % (ref 39.0–52.0)
Hemoglobin: 14.6 g/dL (ref 13.0–17.0)
Lymphocytes Relative: 28.3 % (ref 12.0–46.0)
Lymphs Abs: 1.2 10*3/uL (ref 0.7–4.0)
MCHC: 34 g/dL (ref 30.0–36.0)
MCV: 92.7 fl (ref 78.0–100.0)
Monocytes Absolute: 0.4 10*3/uL (ref 0.1–1.0)
Monocytes Relative: 9.9 % (ref 3.0–12.0)
Neutro Abs: 2.6 10*3/uL (ref 1.4–7.7)
Neutrophils Relative %: 58.3 % (ref 43.0–77.0)
Platelets: 161 10*3/uL (ref 150.0–400.0)
RBC: 4.62 Mil/uL (ref 4.22–5.81)
RDW: 13.6 % (ref 11.5–15.5)
WBC: 4.4 10*3/uL (ref 4.0–10.5)

## 2022-07-07 LAB — HEMOGLOBIN A1C: Hgb A1c MFr Bld: 5.7 % (ref 4.6–6.5)

## 2022-07-07 LAB — TSH: TSH: 5.78 u[IU]/mL — ABNORMAL HIGH (ref 0.35–5.50)
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
## 2022-07-07 NOTE — Patient Instructions (Signed)
Magneium daily 400 mg daily to prevent HA

## 2022-07-07 NOTE — Progress Notes (Signed)
Labs look pretty good except: 1.  Prediabetes-still within reasonable range.  Of course just monitor diet and continue exercise. 2.  LDL was 86-which is above goal of 75.  Had you missed some doses?  May need to consider adding Zetia. 3.  Thyroid is looking like it is trying to go towards hypothyroid.  Suggest repeating TSH, free T4, free T3 in 2 to 3 months

## 2022-07-08 ENCOUNTER — Other Ambulatory Visit: Payer: Self-pay | Admitting: *Deleted

## 2022-07-08 DIAGNOSIS — E785 Hyperlipidemia, unspecified: Secondary | ICD-10-CM

## 2022-07-08 DIAGNOSIS — E039 Hypothyroidism, unspecified: Secondary | ICD-10-CM

## 2022-07-08 MED ORDER — EZETIMIBE 10 MG PO TABS: 10.0000 mg | ORAL_TABLET | Freq: Every day | ORAL | 3 refills | Status: DC

## 2022-07-09 ENCOUNTER — Ambulatory Visit
Admission: RE | Admit: 2022-07-09 | Discharge: 2022-07-09 | Disposition: A | Discharge status: Home / Self Care | Payer: Medicare Other | Source: Ambulatory Visit | Attending: Family Medicine | Admitting: Family Medicine

## 2022-07-09 DIAGNOSIS — G43109 Migraine with aura, not intractable, without status migrainosus: Secondary | ICD-10-CM

## 2022-07-09 DIAGNOSIS — R519 Headache, unspecified: Secondary | ICD-10-CM | POA: Diagnosis not present

## 2022-07-11 ENCOUNTER — Other Ambulatory Visit: Payer: PRIVATE HEALTH INSURANCE

## 2022-10-03 ENCOUNTER — Other Ambulatory Visit: Payer: Self-pay

## 2022-10-03 MED ORDER — ISOSORBIDE MONONITRATE ER 60 MG PO TB24: 60.0000 mg | ORAL_TABLET | Freq: Every day | ORAL | 1 refills | Status: DC

## 2022-10-03 MED ORDER — METOPROLOL SUCCINATE ER 50 MG PO TB24: 50.0000 mg | ORAL_TABLET | Freq: Every day | ORAL | 1 refills | Status: DC

## 2022-10-03 MED ORDER — ICOSAPENT ETHYL 1 G PO CAPS: 2.0000 g | ORAL_CAPSULE | Freq: Two times a day (BID) | ORAL | 1 refills | Status: DC

## 2022-10-03 MED ORDER — ROSUVASTATIN CALCIUM 40 MG PO TABS: 40.0000 mg | ORAL_TABLET | Freq: Every day | ORAL | 1 refills | Status: DC

## 2022-10-06 ENCOUNTER — Telehealth: Payer: Self-pay | Admitting: Cardiovascular Disease

## 2022-10-06 MED ORDER — ICOSAPENT ETHYL 1 G PO CAPS: 2.0000 g | ORAL_CAPSULE | Freq: Two times a day (BID) | ORAL | 1 refills | Status: DC

## 2022-10-06 NOTE — Telephone Encounter (Signed)
Generic RX for 90 days of Vascepa sent to [facility] at this time.

## 2022-10-06 NOTE — Telephone Encounter (Signed)
Pt c/o medication issue:  1. Name of Medication:   icosapent Ethyl (VASCEPA) 1 g capsule    2. How are you currently taking this medication (dosage and times per day)?   Take 2 capsules (2 g total) by mouth 2 (two) times daily.    3. Are you having a reaction (difficulty breathing--STAT)? No  4. What is your medication issue?Micheal from [facility] is requesting that we send a prescription for the generic version of this medication. Micheal stated the patient's insurance does not cover the brand name. Micheal gave a reference number of [id number].

## 2022-10-07 ENCOUNTER — Other Ambulatory Visit: Payer: Self-pay | Admitting: Cardiovascular Disease

## 2022-10-16 IMAGING — DX DG CERVICAL SPINE COMPLETE 4+V
8 series · 8 of 8 positions shown · non-contrast
Comparison: MRI 07/29/2012

CLINICAL DATA: Neck and shoulder pain

EXAM:
CERVICAL SPINE - COMPLETE 4+ VIEW

[c-spine lat (1 of 2)]
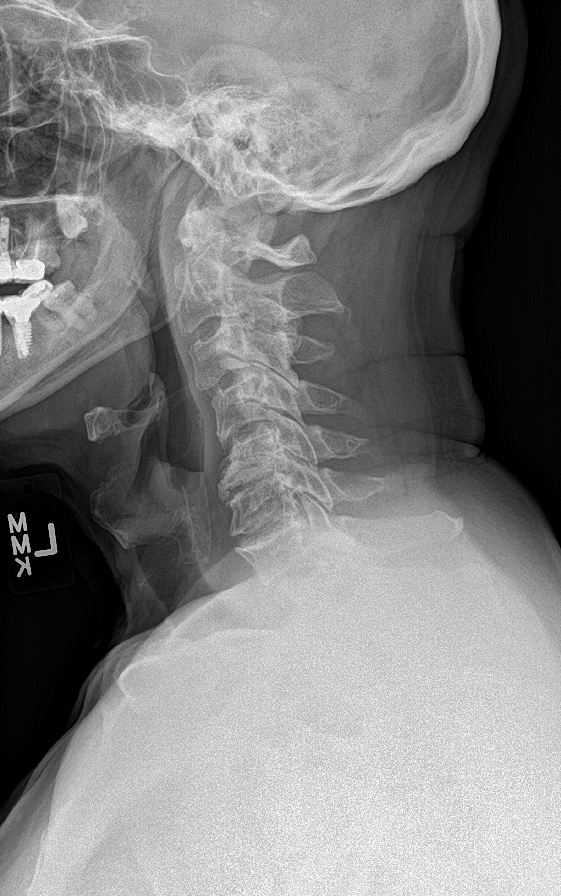

[c-spine obl (1 of 2)]
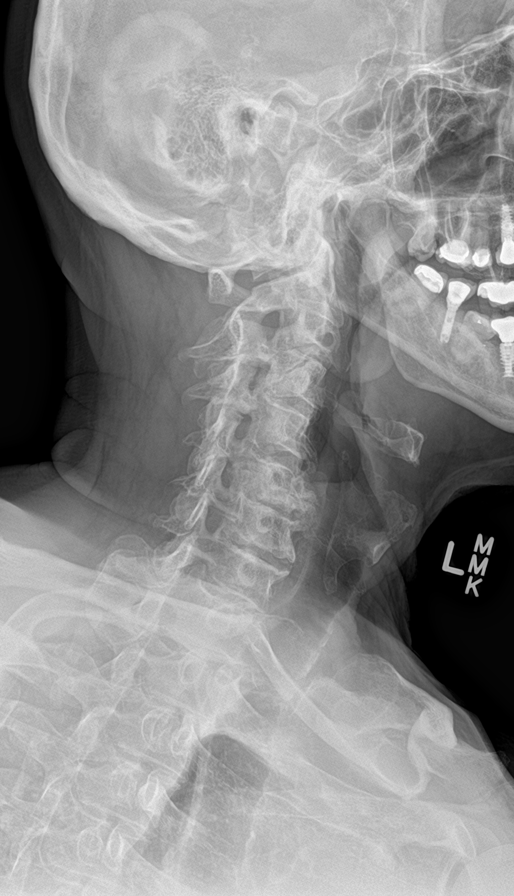

[c-spine obl (2 of 2)]
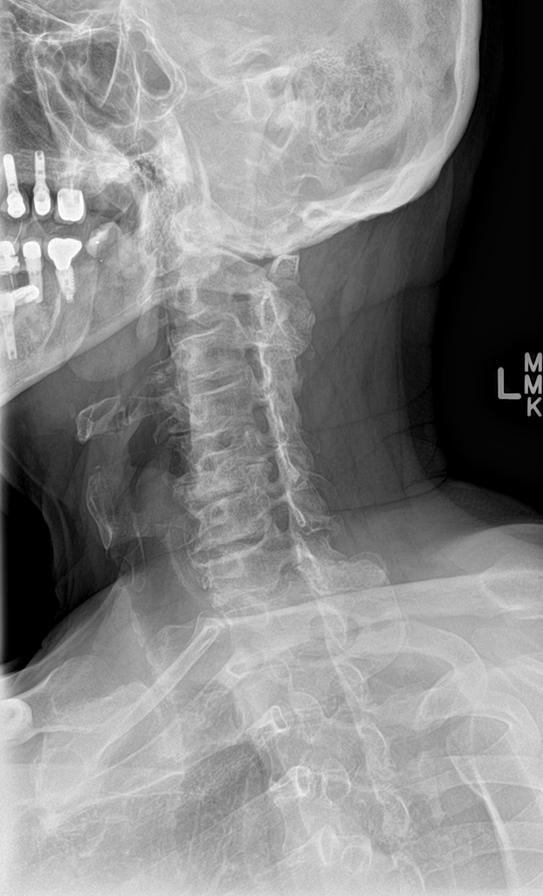

[c-spine ap]
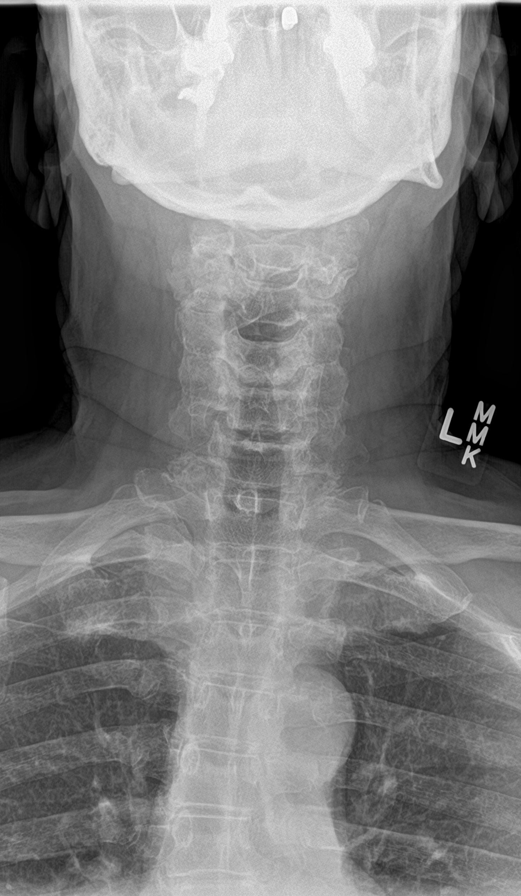

[c-spine open mouth (1 of 2)]
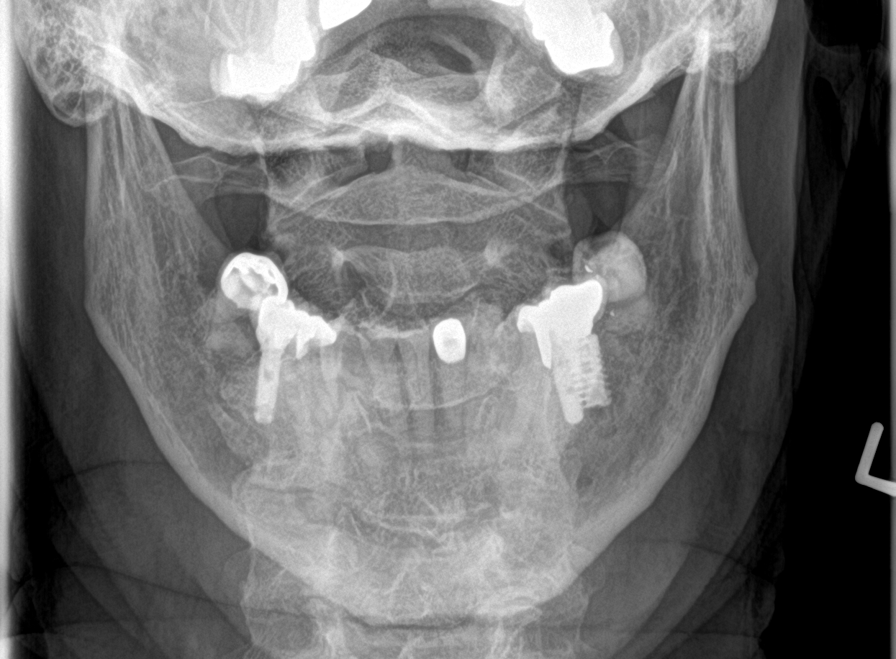

[swimmer]
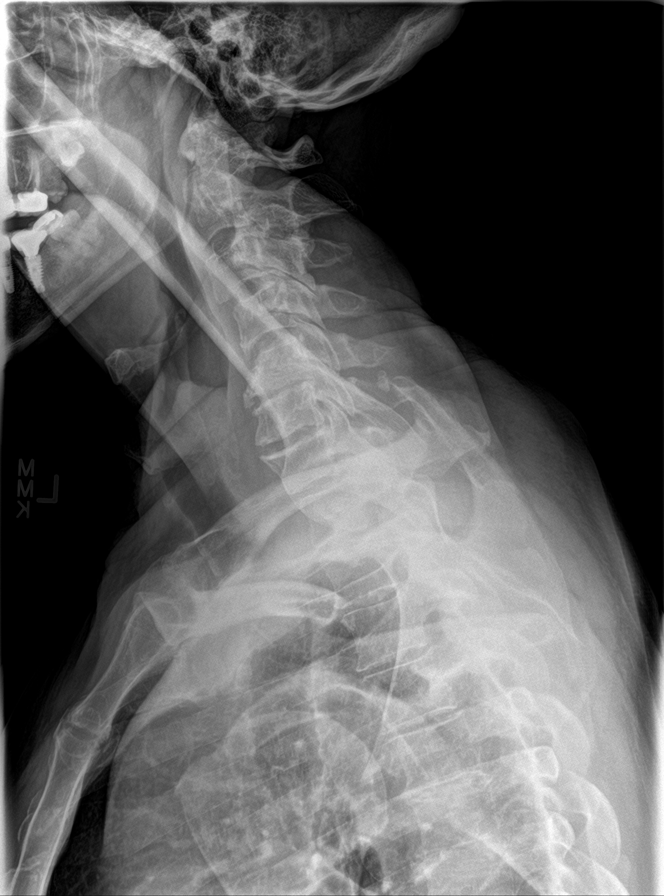

[c-spine lat (2 of 2)]
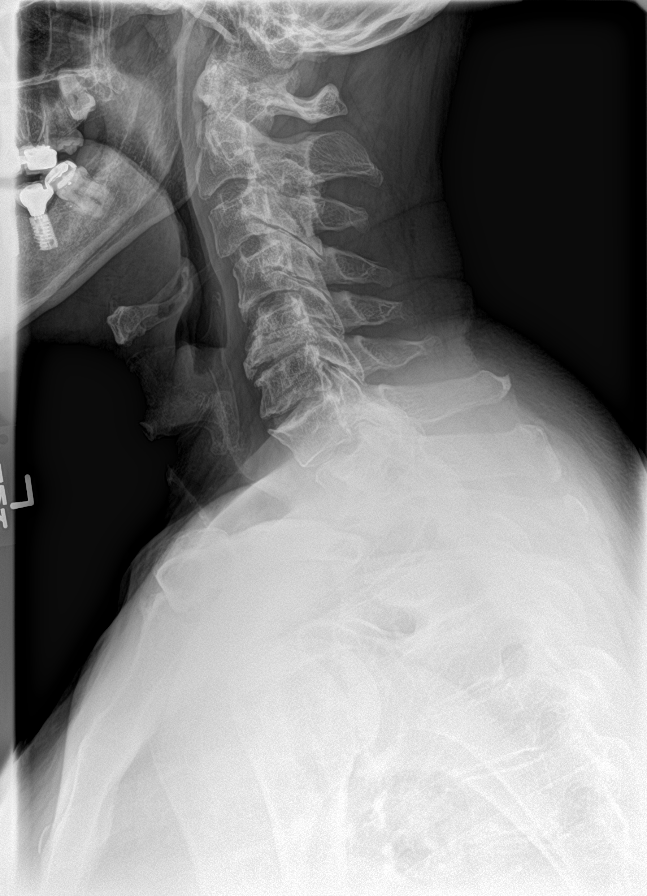

[c-spine open mouth (2 of 2)]
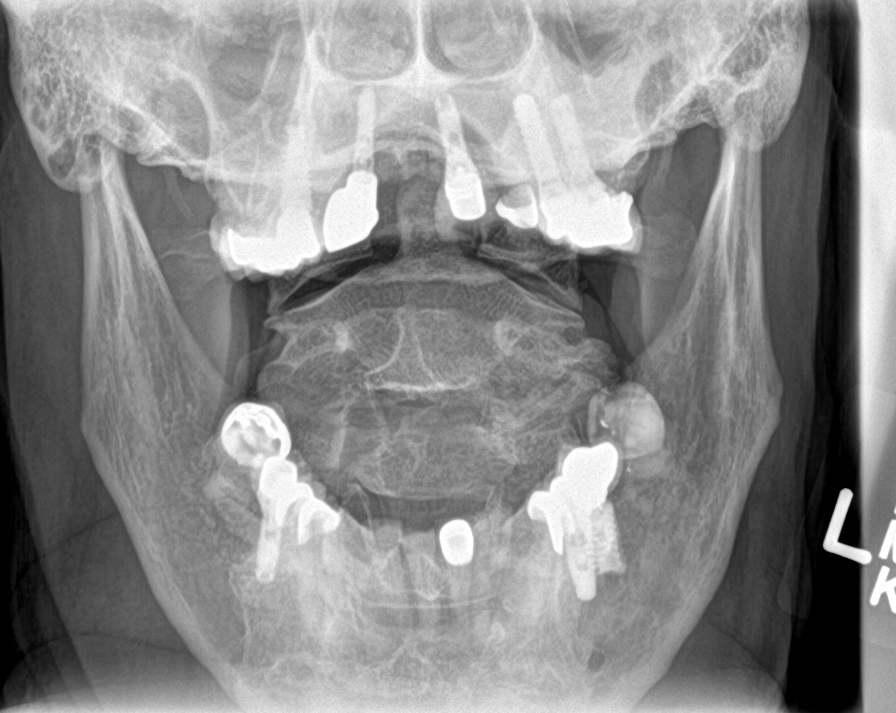

[8 of 8 positions shown; findings below may reference images not displayed]

FINDINGS: Straightening of the cervical spine. Vertebral body heights are
grossly maintained. Multilevel degenerative changes with advanced
disease C5-C6 and C6-C7. Multiple level foraminal narrowing, most
notable at C5-C6 bilaterally. Lateral masses are within normal
limits. Dens is partially obscured by teeth.
IMPRESSION: Straightening of the cervical spine with multilevel degenerative
changes, most notable at C5-C6 and C6-C7.

## 2022-11-07 ENCOUNTER — Encounter: Payer: Self-pay | Admitting: Family Medicine

## 2022-11-07 MED ORDER — EZETIMIBE 10 MG PO TABS: 10.0000 mg | ORAL_TABLET | Freq: Every day | ORAL | 3 refills | Status: DC

## 2022-11-07 NOTE — Addendum Note (Signed)
Addended by: Jobe Gibbon on: 11/07/2022 11:22 AM   Modules accepted: Orders

## 2023-05-01 ENCOUNTER — Ambulatory Visit: Payer: Medicare Other | Admitting: Cardiovascular Disease

## 2023-06-04 ENCOUNTER — Ambulatory Visit: Payer: Medicare Other | Admitting: Cardiovascular Disease

## 2023-07-03 ENCOUNTER — Other Ambulatory Visit: Payer: Self-pay | Admitting: Cardiovascular Disease

## 2023-07-06 ENCOUNTER — Encounter: Payer: Self-pay | Admitting: Cardiovascular Disease

## 2023-07-06 ENCOUNTER — Ambulatory Visit: Attending: Cardiovascular Disease | Admitting: Cardiovascular Disease

## 2023-07-06 VITALS — BP 126/82 | HR 60 | Ht 68.0 in | Wt 183.2 lb

## 2023-07-06 DIAGNOSIS — I25119 Atherosclerotic heart disease of native coronary artery with unspecified angina pectoris: Secondary | ICD-10-CM

## 2023-07-06 DIAGNOSIS — E785 Hyperlipidemia, unspecified: Secondary | ICD-10-CM | POA: Diagnosis not present

## 2023-07-06 DIAGNOSIS — I10 Essential (primary) hypertension: Secondary | ICD-10-CM

## 2023-07-06 DIAGNOSIS — E782 Mixed hyperlipidemia: Secondary | ICD-10-CM

## 2023-07-06 MED ORDER — ISOSORBIDE MONONITRATE ER 60 MG PO TB24: 60.0000 mg | ORAL_TABLET | Freq: Every day | ORAL | 3 refills | Status: AC

## 2023-07-06 MED ORDER — EZETIMIBE 10 MG PO TABS: 10.0000 mg | ORAL_TABLET | Freq: Every day | ORAL | 3 refills | Status: AC

## 2023-07-06 MED ORDER — ROSUVASTATIN CALCIUM 40 MG PO TABS: 40.0000 mg | ORAL_TABLET | Freq: Every day | ORAL | 3 refills | Status: AC

## 2023-07-06 MED ORDER — METOPROLOL SUCCINATE ER 50 MG PO TB24: 50.0000 mg | ORAL_TABLET | Freq: Every day | ORAL | 3 refills | Status: AC

## 2023-07-06 MED ORDER — NITROGLYCERIN 0.4 MG SL SUBL: SUBLINGUAL_TABLET | SUBLINGUAL | 3 refills | Status: AC

## 2023-07-06 NOTE — Patient Instructions (Signed)
 Lab Work: CBC, CMET, Lipids today If you have labs (blood work) drawn today and your tests are completely normal, you will receive your results only by: MyChart Message (if you have MyChart) OR A paper copy in the mail If you have any lab test that is abnormal or we need to change your treatment, we will call you to review the results.  Follow-Up: At [facility], you and your health needs are our priority.  As part of our continuing mission to provide you with exceptional heart care, our providers are all part of one team.  This team includes your primary Cardiologist (physician) and Advanced Practice Providers or APPs (Physician Assistants and Nurse Practitioners) who all work together to provide you with the care you need, when you need it.  Your next appointment:   1 year(s)  Provider:   Arnoldo Lapping, MD

## 2023-07-06 NOTE — Assessment & Plan Note (Signed)
 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  I will continue his current medications.  He is fasting for labs and we will draw a CBC, complete metabolic panel, and lipid panel today.  I will plan to see him back in 1 year.  His medications are refilled today.

## 2023-07-06 NOTE — Progress Notes (Signed)
 Cardiology Office Note:    Date:  07/06/2023   ID:  John Stein, DOB 19-Sep-1945, MRN [id number]  PCP:  Christel Cousins, MD   [facility] Providers Cardiologist:  Arnoldo Lapping, MD     Referring MD: Christel Cousins, MD   Chief Complaint  Patient presents with   Coronary Artery Disease    History of Present Illness:    John Stein is a 78 y.o. male with a hx of  coronary artery disease, presenting for follow-up evaluation. The patient presented with anginal chest pain in 2013 and underwent cardiac catheterization demonstrating critical stenosis of a large first diagonal branch.  He was noted to have nonobstructive disease elsewhere.  Because of the ostial location of the lesion, he underwent Cutting Balloon angioplasty alone with a good angiographic result.  He has been managed medically since that time on a combination of isosorbide  and metoprolol  for antianginal therapy.     The patient is here alone today.  He is doing very well.  He still travels back and forth to [place] and he is physically active with hard work outdoors.  He has no exertional symptoms.  He has not had to use any sublingual nitroglycerin  in some time.  He denies chest pain, chest pressure, or shortness of breath.  He has no leg swelling, heart palpitations, lightheadedness, orthopnea, or PND.   Current Medications: Current Meds  Medication Sig   acetaminophen  (TYLENOL ) 325 MG tablet Take 650 mg by mouth every 6 (six) hours as needed.   aspirin  81 MG chewable tablet Chew 1 tablet (81 mg total) by mouth daily.   [DISCONTINUED] ezetimibe  (ZETIA ) 10 MG tablet Take 1 tablet (10 mg total) by mouth daily.   [DISCONTINUED] icosapent  Ethyl (VASCEPA ) 1 g capsule Take 2 capsules (2 g total) by mouth 2 (two) times daily.   [DISCONTINUED] isosorbide  mononitrate (IMDUR ) 60 MG 24 hr tablet TAKE 1 TABLET DAILY   [DISCONTINUED] metoprolol  succinate (TOPROL -XL) 50 MG 24 hr tablet TAKE 1 TABLET DAILY    [DISCONTINUED] nitroGLYCERIN  (NITROSTAT ) 0.4 MG SL tablet PLACE 1 TABLET UNDER THE TONGUE EVERY 5 MINUTES AS NEEDED FOR CHEST PAIN x 3 DOSES, THEN 911   [DISCONTINUED] rosuvastatin  (CRESTOR ) 40 MG tablet TAKE 1 TABLET DAILY     Allergies:   Patient has no known allergies.   ROS:   Please see the history of present illness.    All other systems reviewed and are negative.  EKGs/Labs/Other Studies Reviewed:    The following studies were reviewed today: Cardiac Studies & Procedures   ______________________________________________________________________________________________   STRESS TESTS  MYOCARDIAL PERFUSION IMAGING 10/03/2015  Narrative  Nuclear stress EF: 49%.  Blood pressure demonstrated a hypertensive response to exercise.  There was no ST segment deviation noted during stress.  This is a low risk study.  The left ventricular ejection fraction is mildly decreased (45-54%).  Normal resting and stress perfusion. No ischemia or infarction EF 49% but visually looks normal. HTN response to exercise        CT SCANS  CT CORONARY FRACTIONAL FLOW RESERVE DATA PREP 12/31/2020  Narrative EXAM: CT FFR ANALYSIS  FINDINGS: CT FFR analysis was performed on the original cardiac computed tomography angiogram dataset. Diagrammatic representation of the CT FFR analysis is provided in a separate PDF document in PACS. This dictation was created using the PDF document and an interactive 3D model of the results. The 3D model is not available in the EMR/PACS. Normal CT FFR range is >0.80.  1. Left Main: No significant stenosis.  2. LAD: FFR 0.86 distally.  D1 FFR 0.93. 3. LCX: FFR 0.93 distally. 4. RCA: No significant stenosis.  IMPRESSION: 1.  CT FFR analysis didn't show any significant stenosis.  Peder Bourdon, MD  MEDICATIONS: None  Peder Bourdon   Electronically Signed By: Peder Bourdon M.D. On: 01/01/2021 18:34   CT CORONARY MORPH W/CTA COR W/SCORE  12/31/2020  Addendum 12/31/2020  1:57 PM ADDENDUM REPORT: 12/31/2020 13:55  EXAM: CT FFR ANALYSIS  FINDINGS: CT FFR analysis was performed on the original cardiac computed tomography angiogram dataset. Diagrammatic representation of the CT FFR analysis is provided in a separate PDF document in PACS. This dictation was created using the PDF document and an interactive 3D model of the results. The 3D model is not available in the EMR/PACS. Normal CT FFR range is >0.80.  1. Left Main: No significant stenosis.  2. LAD: 0.93 after proximal LAD stenosis.  0.93 mid D1. 3. LCX: 0.93 mid PLOM. 4. RCA: No significant stenosis.  IMPRESSION: The stenoses in the LAD, D1, and PLOM do not appear to be hemodynamically significant.  Peder Bourdon, MD   Electronically Signed By: Peder Bourdon M.D. On: 12/31/2020 13:55  Addendum 12/31/2020  1:51 PM ADDENDUM REPORT: 12/31/2020 13:48  CLINICAL DATA:  Chest pain  EXAM: Cardiac CTA  MEDICATIONS: Sub lingual nitro. 4mg  x 2  TECHNIQUE: The patient was scanned on a Siemens 192 slice scanner. Gantry rotation speed was 250 msecs. Collimation was 0.6 mm. A 100 kV prospective scan was triggered in the ascending thoracic aorta at 35-75% of the R-R interval. Average HR during the scan was 60 bpm. The 3D data set was interpreted on a dedicated work station using MPR, MIP and VRT modes. A total of 80cc of contrast was used.  FINDINGS: Non-cardiac: See separate report from [facility].  No LA appendage thrombus. Pulmonary veins drain normally to the left atrium.  Calcium  Score: 533 Agatston units.  Coronary Arteries: Right dominant with no anomalies  LM: No plaque or stenosis.  LAD system: Mixed plaque in the proximal LAD with a napkin ring-type stenosis just proximal to take-off of D1. Possible severe (70-99%) stenosis. Moderate-sized D1 with moderate (51-69%) stenosis ostially.  Circumflex system: Calcified plaque mid  LCx, minimal stenosis. Moderate PLOM with possible moderate (51-69%) stenosis proximal vessel.  RCA system: Calcified plaque mid RCA, minimal stenosis. Calcified plaque distal RCA, minimal stenosis.  IMPRESSION: 1. Coronary calcium  score 533 Agatston units. This places the patient in the 67th percentile for age and gender, suggesting intermediate risk for future cardiac events.  2. Possible severe napkin ring stenosis in the proximal LAD at D1 with ostial D1 stenosis.  3.  Concern for moerate stenosis proximal PLOM.  Will send for FFR.  Dalton Mclean   Electronically Signed By: Peder Bourdon M.D. On: 12/31/2020 13:48  Narrative EXAM: OVER-READ INTERPRETATION  CT CHEST  The following report is an over-read performed by radiologist Dr. Alexandria Angel of [facility] on 12/31/2020. This over-read does not include interpretation of cardiac or coronary anatomy or pathology. The coronary calcium  score/coronary CTA interpretation by the cardiologist is attached.  COMPARISON:  None.  FINDINGS: Within the visualized portions of the thorax there are no suspicious appearing pulmonary nodules or masses, there is no acute consolidative airspace disease, no pleural effusions, no pneumothorax and no lymphadenopathy. Visualized portions of the upper abdomen demonstrate a well-defined 2.7 x 2.3 cm low-attenuation lesion in segments 2/3 of the liver,  compatible with a large simple cyst there are no aggressive appearing lytic or blastic lesions noted in the visualized portions of the skeleton. Multiple old healed posterolateral left-sided rib fractures are incidentally noted.  IMPRESSION: .  1. No significant incidental noncardiac findings are noted.  Electronically Signed: By: Alexandria Angel M.D. On: 12/31/2020 09:25     ______________________________________________________________________________________________      EKG:   EKG  Interpretation Date/Time:  Monday Jul 06 2023 10:12:42 EDT Ventricular Rate:  60 PR Interval:  166 QRS Duration:  136 QT Interval:  438 QTC Calculation: 438 R Axis:   -18  Text Interpretation: Normal sinus rhythm Right bundle branch block When compared with ECG of 12-Dec-2011 05:43, Right bundle branch block is now Present Minimal criteria for Anterior infarct are no longer Present Confirmed by Arnoldo Lapping [phone]) on 07/06/2023 10:22:32 AM    Recent Labs: 07/07/2022: ALT 18; BUN 17; Creatinine, Ser 0.85; Hemoglobin 14.6; Platelets 161.0; Potassium 4.5; Sodium 141; TSH 5.78  Recent Lipid Panel    Component Value Date/Time   CHOL 159 07/07/2022 1000   CHOL 147 02/21/2022 1526   TRIG 84.0 07/07/2022 1000   HDL 56.60 07/07/2022 1000   HDL 55 02/21/2022 1526   CHOLHDL 3 07/07/2022 1000   VLDL 16.8 07/07/2022 1000   LDLCALC 86 07/07/2022 1000   LDLCALC 75 02/21/2022 1526   LDLDIRECT 156.5 12/01/2011 0958     Risk Assessment/Calculations:                Physical Exam:    VS:  BP 126/82   Pulse 60   Ht 5\' 8"  (1.727 m)   Wt 183 lb 3.2 oz (83.1 kg)   SpO2 97%   BMI 27.86 kg/m     Wt Readings from Last 3 Encounters:  07/06/23 183 lb 3.2 oz (83.1 kg)  07/07/22 176 lb 4 oz (79.9 kg)  02/21/22 175 lb 9.6 oz (79.7 kg)     GEN:  Well nourished, well developed in no acute distress HEENT: Normal NECK: No JVD; No carotid bruits LYMPHATICS: No lymphadenopathy CARDIAC: RRR, no murmurs, rubs, gallops RESPIRATORY:  Clear to auscultation without rales, wheezing or rhonchi  ABDOMEN: Soft, non-tender, non-distended MUSCULOSKELETAL:  No edema; No deformity  SKIN: Warm and dry NEUROLOGIC:  Alert and oriented x 3 PSYCHIATRIC:  Normal affect   Assessment & Plan Coronary artery disease involving native coronary artery of native heart with angina pectoris (HCC) Stable on aspirin , isosorbide  and metoprolol  succinate for antianginal therapy, and high intensity statin drug.  Continue  current management.  CT scan from 2022 showed no flow-limiting coronary stenoses at that time.  He has had no clinical change in symptoms since then. Mixed hyperlipidemia Treated with ezetimibe  and rosuvastatin .  Last lipids were above goal and ezetimibe  was added.  Will repeat lipids today as he is fasting.  LFTs have been normal. Essential hypertension Blood pressure well-controlled on metoprolol  succinate.  Continue the same. 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  I will continue his current medications.  He is fasting for labs and we will draw a CBC, complete metabolic panel, and lipid panel today.  I will plan to see him back in 1 year.  His medications are refilled today.      Medication Adjustments/Labs and Tests Ordered: Current medicines are reviewed at length with the patient today.  Concerns regarding medicines are outlined above.  Orders  Placed This Encounter  Procedures   CBC   Comprehensive metabolic panel with GFR   Lipid panel   EKG 12-Lead   Meds ordered this encounter  Medications   ezetimibe  (ZETIA ) 10 MG tablet    Sig: Take 1 tablet (10 mg total) by mouth daily.    Dispense:  90 tablet    Refill:  3   isosorbide  mononitrate (IMDUR ) 60 MG 24 hr tablet    Sig: Take 1 tablet (60 mg total) by mouth daily.    Dispense:  90 tablet    Refill:  3   metoprolol  succinate (TOPROL -XL) 50 MG 24 hr tablet    Sig: Take 1 tablet (50 mg total) by mouth daily.    Dispense:  90 tablet    Refill:  3   rosuvastatin  (CRESTOR ) 40 MG tablet    Sig: Take 1 tablet (40 mg total) by mouth daily.    Dispense:  90 tablet    Refill:  3   nitroGLYCERIN  (NITROSTAT ) 0.4 MG SL tablet    Sig: PLACE 1 TABLET UNDER THE TONGUE EVERY 5 MINUTES AS NEEDED FOR CHEST PAIN x 3 DOSES, THEN 911    Dispense:  75 tablet    Refill:  3    Patient Instructions  Lab Work: CBC, CMET, Lipids today If you  have labs (blood work) drawn today and your tests are completely normal, you will receive your results only by: MyChart Message (if you have MyChart) OR A paper copy in the mail If you have any lab test that is abnormal or we need to change your treatment, we will call you to review the results.  Follow-Up: At [facility] you and your health needs are our priority.  As part of our continuing mission to provide you with exceptional heart care, our providers are all part of one team.  This team includes your primary Cardiologist (physician) and Advanced Practice Providers or APPs (Physician Assistants and Nurse Practitioners) who all work together to provide you with the care you need, when you need it.  Your next appointment:   1 year(s)  Provider:   Arnoldo Lapping, MD          Signed, Arnoldo Lapping, MD  07/06/2023 10:39 AM    [facility]

## 2023-07-07 LAB — CBC
Hematocrit: 45.2 % (ref 37.5–51.0)
Hemoglobin: 15.2 g/dL (ref 13.0–17.7)
MCH: 31.1 pg (ref 26.6–33.0)
MCHC: 33.6 g/dL (ref 31.5–35.7)
MCV: 93 fL (ref 79–97)
Platelets: 193 10*3/uL (ref 150–450)
RBC: 4.88 x10E6/uL (ref 4.14–5.80)
RDW: 13.3 % (ref 11.6–15.4)
WBC: 4.9 10*3/uL (ref 3.4–10.8)

## 2023-07-07 LAB — COMPREHENSIVE METABOLIC PANEL WITH GFR
ALT: 21 IU/L (ref 0–44)
AST: 20 IU/L (ref 0–40)
Albumin: 4.3 g/dL (ref 3.8–4.8)
Alkaline Phosphatase: 71 IU/L (ref 44–121)
BUN/Creatinine Ratio: 28 — ABNORMAL HIGH (ref 10–24)
BUN: 23 mg/dL (ref 8–27)
Bilirubin Total: 0.8 mg/dL (ref 0.0–1.2)
CO2: 25 mmol/L (ref 20–29)
Calcium: 9.1 mg/dL (ref 8.6–10.2)
Chloride: 105 mmol/L (ref 96–106)
Creatinine, Ser: 0.81 mg/dL (ref 0.76–1.27)
Globulin, Total: 2 g/dL (ref 1.5–4.5)
Glucose: 100 mg/dL — ABNORMAL HIGH (ref 70–99)
Potassium: 4.5 mmol/L (ref 3.5–5.2)
Sodium: 143 mmol/L (ref 134–144)
Total Protein: 6.3 g/dL (ref 6.0–8.5)
eGFR: 91 mL/min/{1.73_m2} (ref >59)

## 2023-07-07 LAB — LIPID PANEL
Chol/HDL Ratio: 2.6 ratio (ref 0.0–5.0)
Cholesterol, Total: 140 mg/dL (ref 100–199)
HDL: 54 mg/dL (ref >39)
LDL Chol Calc (NIH): 66 mg/dL (ref 0–99)
Triglycerides: 113 mg/dL (ref 0–149)
VLDL Cholesterol Cal: 20 mg/dL (ref 5–40)

## 2023-07-08 ENCOUNTER — Ambulatory Visit (INDEPENDENT_AMBULATORY_CARE_PROVIDER_SITE_OTHER): Payer: PRIVATE HEALTH INSURANCE | Admitting: Family Medicine

## 2023-07-08 ENCOUNTER — Encounter: Payer: Self-pay | Admitting: Family Medicine

## 2023-07-08 VITALS — BP 136/79 | HR 55 | Temp 97.7°F | Resp 16 | Ht 68.0 in | Wt 177.2 lb

## 2023-07-08 DIAGNOSIS — I10 Essential (primary) hypertension: Secondary | ICD-10-CM

## 2023-07-08 DIAGNOSIS — R7989 Other specified abnormal findings of blood chemistry: Secondary | ICD-10-CM

## 2023-07-08 DIAGNOSIS — R351 Nocturia: Secondary | ICD-10-CM | POA: Diagnosis not present

## 2023-07-08 DIAGNOSIS — R1310 Dysphagia, unspecified: Secondary | ICD-10-CM

## 2023-07-08 DIAGNOSIS — R7303 Prediabetes: Secondary | ICD-10-CM

## 2023-07-08 DIAGNOSIS — E782 Mixed hyperlipidemia: Secondary | ICD-10-CM

## 2023-07-08 DIAGNOSIS — I2510 Atherosclerotic heart disease of native coronary artery without angina pectoris: Secondary | ICD-10-CM

## 2023-07-08 LAB — HEMOGLOBIN A1C: Hgb A1c MFr Bld: 5.8 % (ref 4.6–6.5)

## 2023-07-08 LAB — TSH: TSH: 5 u[IU]/mL (ref 0.35–5.50)

## 2023-07-08 LAB — PSA: PSA: 2.86 ng/mL (ref 0.10–4.00)

## 2023-07-08 LAB — T3, FREE: T3, Free: 4.3 pg/mL — ABNORMAL HIGH (ref 2.3–4.2)

## 2023-07-08 LAB — T4, FREE: Free T4: 0.83 ng/dL (ref 0.60–1.60)

## 2023-07-08 MED ORDER — OMEPRAZOLE 40 MG PO CPDR: 40.0000 mg | DELAYED_RELEASE_CAPSULE | Freq: Every day | ORAL | 1 refills | Status: AC

## 2023-07-08 NOTE — Progress Notes (Signed)
 Subjective:     Patient ID: John Stein, male    DOB: [birth year], 78 y.o.   MRN: [id number]  Chief Complaint  Patient presents with   Annual Exam    CPE Fasting     HPI Htn,hld cad, predm, osa Discussed the use of AI scribe software for clinical note transcription with the patient, who gave verbal consent to proceed.  History of Present Illness John Stein is a 78 year old male who presents for evaluation of dysphagia and concerns about colonoscopy screening.  He has been experiencing intermittent dysphagia over the past three to four months, primarily with solid foods. He describes episodes where food feels stuck, occurring occasionally. No regular reflux symptoms, but he notes occasional reflux when eating late, approximately once a week or month. He does not take any medication for stomach pain and has not been using any treatment for reflux.  He is concerned about not having had a colonoscopy in over ten years. He is interested in discussing this further with a gastroenterologist.  He has a history of coronary artery disease and has previously undergone angioplasty. He is currently taking aspirin  daily, Zetia  10 mg, rosuvastatin  40 mg, metoprolol  50 mg, and Imdur  60 mg daily. No chest pain, shortness of breath, or swelling in the legs. His blood pressure is well-controlled, usually around 130/80.  He has a history of kidney issues but reports no current urinary symptoms. He mentions a past episode of pain at the end of urination, which resolved on its own. No changes in urinary stream or nocturia.  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 No depressive symptoms or suicidal thoughts.  He has had shingles twice, affecting the thoracic and occipital regions. He has not received the shingles vaccine yet. He has received the pneumonia vaccine but is unsure about his last tetanus shot.  A1C  borderline-exercises, healthy diet    There are no preventive care reminders to display for this patient.   Past Medical History:  Diagnosis Date   Basal cell carcinoma of dorsum of nose    CAD (coronary artery disease)    a. 12/11/2011 Ex MV: Med/Sev ant & antlat ischemia, EF 53%, 3mm ST dep V4-V6;  b. 12/11/2011 Cath/PTCA: LM nl, LAD 20-66m, D1 99ost (Tx w/ PTCA only), LCX 40/40m, RCA diff nonobs dzs, EF 55%, sublte dist antlat HK.     History of nephrolithiasis    History of smallpox childhood   HTN (hypertension)    Hyperlipidemia    Kidney stone ~ 2003   Shingles rash 2011   right scalp    Past Surgical History:  Procedure Laterality Date   CORONARY ANGIOPLASTY  12/11/2011   LEFT HEART CATHETERIZATION WITH CORONARY ANGIOGRAM N/A 12/11/2011   Procedure: LEFT HEART CATHETERIZATION WITH CORONARY ANGIOGRAM;  Surgeon: Arnoldo Lapping, MD;  Location: [facility] CATH LAB;  Service: Cardiovascular;  Laterality: N/A;   TONSILLECTOMY AND ADENOIDECTOMY  1972   VASECTOMY  2009     Current Outpatient Medications:    acetaminophen  (TYLENOL ) 325 MG tablet, Take 650 mg by mouth every 6 (six) hours as needed., Disp: , Rfl:    aspirin  81 MG chewable tablet, Chew 1 tablet (81 mg total) by mouth daily., Disp: , Rfl:    ezetimibe  (ZETIA ) 10 MG tablet, Take 1 tablet (10 mg total) by mouth daily., Disp: 90 tablet, Rfl: 3   isosorbide  mononitrate (IMDUR ) 60 MG 24 hr tablet, Take 1  tablet (60 mg total) by mouth daily., Disp: 90 tablet, Rfl: 3   MAGNESIUM PO, Take 2 tablets by mouth daily., Disp: , Rfl:    metoprolol  succinate (TOPROL -XL) 50 MG 24 hr tablet, Take 1 tablet (50 mg total) by mouth daily., Disp: 90 tablet, Rfl: 3   nitroGLYCERIN  (NITROSTAT ) 0.4 MG SL tablet, PLACE 1 TABLET UNDER THE TONGUE EVERY 5 MINUTES AS NEEDED FOR CHEST PAIN x 3 DOSES, THEN 911, Disp: 75 tablet, Rfl: 3   omeprazole  (PRILOSEC) 40 MG capsule, Take 1 capsule (40 mg total) by mouth daily., Disp: 90 capsule, Rfl: 1    rosuvastatin  (CRESTOR ) 40 MG tablet, Take 1 tablet (40 mg total) by mouth daily., Disp: 90 tablet, Rfl: 3  No Known Allergies ROS neg/noncontributory except as noted HPI/below ROS: Gen: no fever, chills  Skin: no rash, itching ENT: no ear pain, ear drainage, nasal congestion, rhinorrhea, sinus pressure, sore throat Eyes: no blurry vision, double vision Resp: no cough, wheeze,SOB CV: no CP, palpitations, LE edema,  GI: no heartburn, n/v/d/c, abd pain GU: intermitt issues MSK: hands Neuro: no dizziness, headache, weakness, vertigo Psych: no depression, anxiety, insomnia, SI      Objective:     BP 136/79   Pulse (!) 55   Temp 97.7 F (36.5 C) (Temporal)   Resp 16   Ht 5\' 8"  (1.727 m)   Wt 177 lb 4 oz (80.4 kg)   SpO2 97%   BMI 26.95 kg/m  Wt Readings from Last 3 Encounters:  07/08/23 177 lb 4 oz (80.4 kg)  07/06/23 183 lb 3.2 oz (83.1 kg)  07/07/22 176 lb 4 oz (79.9 kg)    Physical Exam   Gen: WDWN NAD HEENT: NCAT, conjunctiva not injected, sclera nonicteric TM WNL B, OP moist, no exudates  NECK:  supple, no thyromegaly, no nodes, no carotid bruits CARDIAC: RRR, S1S2+, no murmur. DP 2+B LUNGS: CTAB. No wheezes ABDOMEN:  BS+, soft, NTND, No HSM, no masses EXT:  no edema MSK: no gross abnormalities.  NEURO: A&O x3.  CN II-XII intact.  PSYCH: normal mood. Good eye contact     Assessment & Plan:  Atherosclerosis of native coronary artery of native heart without angina pectoris  Primary hypertension  Mixed hyperlipidemia -     T4, free -     TSH  Prediabetes -     TSH -     Hemoglobin A1c  Dysphagia, unspecified type -     Ambulatory referral to Gastroenterology  Abnormal TSH -     T3, free -     T4, free -     TSH  Nocturia -     PSA  Other orders -     Omeprazole ; Take 1 capsule (40 mg total) by mouth daily.  Dispense: 90 capsule; Refill: 1  Assessment and Plan Assessment & Plan Coronary artery disease with history of angioplasty    Angioplasty was performed without stent placement due to anatomical considerations. He has no current cardiac symptoms, and follow-up with cardiologist Dr. Arlester Ladd indicates the condition is well-managed. He continues on aspirin , metoprolol , and Imdur .  Hypertension   Blood pressure is well-controlled with current medications, consistently reading below 130/80 mmHg. He reports no chest pain, dyspnea, or peripheral edema.  Hyperlipidemia   Cholesterol levels are well-managed with Zetia  and rosuvastatin . Recent labs show cholesterol at 140 mg/dL, HDL at 54 mg/dL, triglycerides at 914 mg/dL, and LDL at 66 mg/dL.  Gastroesophageal reflux disease (GERD)   He experiences intermittent  dysphagia and occasional reflux, particularly after meals, for the past 3-4 months, possibly due to silent reflux. If symptoms persist despite treatment, an endoscopy may be needed. Start omeprazole  daily and refer to GI for further evaluation.  Dysphagia   Intermittent dysphagia occurs primarily with solid foods, with no specific food triggers identified. Discussed the importance of thorough mastication and hydration, considering potential age-related esophageal changes.  Subclinical hypothyroidism   Slightly elevated TSH levels suggest possible subclinical hypothyroidism, though no specific symptoms are reported. Monitoring and potential treatment will be based on further lab results. Order TSH, free T4, and free T3 to assess thyroid  function.  History of kidney issues   No current renal symptoms. BUN is slightly elevated at 28 mg/dL, likely due to high protein intake, while creatinine remains normal at 0.8 mg/dL.  History of shingles   He previously had shingles in the thoracic and occipital regions. Despite prior infection, vaccination is important. Advise receiving the Shingrix vaccine at the pharmacy.  Wellness Visit   He is a 78 year old male, active and generally healthy. Discussed the importance of physical  activity, nutrition, and a healthy lifestyle. He declined the Medicare annual wellness visit.  General Health Maintenance   Discussed the importance of immunizations and routine screenings. He received the pneumonia vaccine. The tetanus vaccine is outdated and requires an update. Advised cessation of routine colonoscopy and PSA screening after age 36 due to increased risks and low benefit. Advise receiving the TDAP vaccine at the pharmacy.    Return in about 1 year (around 07/07/2024) for chronic follow-up.  John Haas, MD

## 2023-07-08 NOTE — Progress Notes (Signed)
 Thyroid  is fine.  FT3-0.1 high- if getting hyper symptoms(diarrhea, heart racing, wt loss) let me know.   Rest of labs ok

## 2023-07-08 NOTE — Patient Instructions (Addendum)
 It was very nice to see you today!  Shingrix-get at pharm Tdap-get at pharm   PLEASE NOTE:  If you had any lab tests please let us  know if you have not heard back within a few days. You may see your results on MyChart before we have a chance to review them but we will give you a call once they are reviewed by us . If we ordered any referrals today, please let us  know if you have not heard from their office within the next week.   Please try these tips to maintain a healthy lifestyle:  Eat most of your calories during the day when you are active. Eliminate processed foods including packaged sweets (pies, cakes, cookies), reduce intake of potatoes, white bread, white pasta, and white rice. Look for whole grain options, oat flour or almond flour.  Each meal should contain half fruits/vegetables, one quarter protein, and one quarter carbs (no bigger than a computer mouse).  Cut down on sweet beverages. This includes juice, soda, and sweet tea. Also watch fruit intake, though this is a healthier sweet option, it still contains natural sugar! Limit to 3 servings daily.  Drink at least 1 glass of water with each meal and aim for at least 8 glasses per day  Exercise at least 150 minutes every week.

## 2023-07-13 ENCOUNTER — Encounter: Payer: Self-pay | Admitting: Family Medicine

## 2023-07-13 ENCOUNTER — Other Ambulatory Visit: Payer: Self-pay

## 2023-07-13 DIAGNOSIS — N50811 Right testicular pain: Secondary | ICD-10-CM

## 2023-07-13 NOTE — Telephone Encounter (Signed)
 Please see pt msg/concern and advise on referral requested

## 2023-07-15 ENCOUNTER — Encounter: Payer: Self-pay | Admitting: Family Medicine

## 2023-08-06 ENCOUNTER — Ambulatory Visit: Payer: Self-pay | Admitting: Podiatry

## 2023-12-16 DIAGNOSIS — N200 Calculus of kidney: Secondary | ICD-10-CM | POA: Diagnosis not present

## 2023-12-17 DIAGNOSIS — N410 Acute prostatitis: Secondary | ICD-10-CM | POA: Diagnosis not present

## 2023-12-17 DIAGNOSIS — N200 Calculus of kidney: Secondary | ICD-10-CM | POA: Diagnosis not present

## 2023-12-17 DIAGNOSIS — N138 Other obstructive and reflux uropathy: Secondary | ICD-10-CM | POA: Diagnosis not present

## 2023-12-17 DIAGNOSIS — N401 Enlarged prostate with lower urinary tract symptoms: Secondary | ICD-10-CM | POA: Diagnosis not present

## 2024-01-19 ENCOUNTER — Encounter: Payer: Self-pay | Admitting: Family Medicine

## 2024-01-19 ENCOUNTER — Ambulatory Visit: Admitting: Family Medicine

## 2024-01-19 VITALS — BP 116/68 | HR 74 | Temp 97.2°F | Ht 68.0 in | Wt 187.1 lb

## 2024-01-19 DIAGNOSIS — R7989 Other specified abnormal findings of blood chemistry: Secondary | ICD-10-CM | POA: Diagnosis not present

## 2024-01-19 DIAGNOSIS — T466X5A Adverse effect of antihyperlipidemic and antiarteriosclerotic drugs, initial encounter: Secondary | ICD-10-CM

## 2024-01-19 DIAGNOSIS — E559 Vitamin D deficiency, unspecified: Secondary | ICD-10-CM | POA: Diagnosis not present

## 2024-01-19 DIAGNOSIS — E034 Atrophy of thyroid (acquired): Secondary | ICD-10-CM

## 2024-01-19 DIAGNOSIS — E785 Hyperlipidemia, unspecified: Secondary | ICD-10-CM | POA: Diagnosis not present

## 2024-01-19 DIAGNOSIS — M791 Myalgia, unspecified site: Secondary | ICD-10-CM

## 2024-01-19 DIAGNOSIS — Z79899 Other long term (current) drug therapy: Secondary | ICD-10-CM

## 2024-01-19 DIAGNOSIS — I2510 Atherosclerotic heart disease of native coronary artery without angina pectoris: Secondary | ICD-10-CM

## 2024-01-19 DIAGNOSIS — R7303 Prediabetes: Secondary | ICD-10-CM

## 2024-01-19 MED ORDER — REPATHA SURECLICK 140 MG/ML ~~LOC~~ SOAJ: 140.0000 mg | SUBCUTANEOUS | 3 refills | Status: DC

## 2024-01-19 NOTE — Progress Notes (Signed)
 Subjective:     Patient ID: John Stein, male    DOB: 1945-10-24, 78 y.o.   MRN: [id number]  Chief Complaint  Patient presents with   Leg Pain    Pt states he is having severe leg (muscle) pain    Discussed the use of AI scribe software for clinical note transcription with the patient, who gave verbal consent to proceed.  History of Present Illness Dalon Reichart is a 78 year old male with coronary artery disease who presents with leg pain associated with rosuvastatin  use.  He experiences significant leg pain, described as a 'cramp,' which began about seven months ago after being prescribed rosuvastatin  and Zetia . He discontinued Zetia  in May but continued with rosuvastatin , and the pain persisted, especially during physical activities like climbing in the [place], which required assistance to descend due to the severity of the pain.  He stopped taking rosuvastatin  for ten days while in [place], during which time his symptoms improved, allowing him to be active again. However, upon resuming rosuvastatin , the leg pain returned. The pain is severe enough to impact daily activities, including gardening and climbing, which he enjoys.  He has a history of coronary artery disease and has undergone angioplasty and stent placement. Recent lab work showed elevated triglycerides at 262 mg/dL while off both medications, with LDL at 67.9 mg/dL. He has also experienced a weight gain of about 10 pounds due to decreased activity levels.  He also reports a past adverse reaction to a different statin about ten years ago, which caused significant discomfort after two months of use.  No chest pain or shortness of breath. He is currently taking magnesium twice a day and apple cider vinegar to aid his condition. He has been inactive recently, contributing to weight gain from 178 to 188 pounds.    Health Maintenance Due  Topic Date Due   Medicare Annual Wellness (AWV)  Never done    Past Medical  History:  Diagnosis Date   Basal cell carcinoma of dorsum of nose    CAD (coronary artery disease)    a. 12/11/2011 Ex MV: Med/Sev ant & antlat ischemia, EF 53%, 3mm ST dep V4-V6;  b. 12/11/2011 Cath/PTCA: LM nl, LAD 20-35m, D1 99ost (Tx w/ PTCA only), LCX 40/42m, RCA diff nonobs dzs, EF 55%, sublte dist antlat HK.     History of nephrolithiasis    History of smallpox childhood   HTN (hypertension)    Hyperlipidemia    Kidney stone ~ 2003   Shingles rash 2011   right scalp    Past Surgical History:  Procedure Laterality Date   CORONARY ANGIOPLASTY  12/11/2011   LEFT HEART CATHETERIZATION WITH CORONARY ANGIOGRAM N/A 12/11/2011   Procedure: LEFT HEART CATHETERIZATION WITH CORONARY ANGIOGRAM;  Surgeon: Ozell Fell, MD;  Location: [facility] CATH LAB;  Service: Cardiovascular;  Laterality: N/A;   TONSILLECTOMY AND ADENOIDECTOMY  1972   VASECTOMY  2009     Current Outpatient Medications:    acetaminophen  (TYLENOL ) 325 MG tablet, Take 650 mg by mouth every 6 (six) hours as needed., Disp: , Rfl:    aspirin  81 MG chewable tablet, Chew 1 tablet (81 mg total) by mouth daily., Disp: , Rfl:    Evolocumab (REPATHA SURECLICK) 140 MG/ML SOAJ, Inject 140 mg into the skin every 14 (fourteen) days., Disp: 6 mL, Rfl: 3   ezetimibe  (ZETIA ) 10 MG tablet, Take 1 tablet (10 mg total) by mouth daily., Disp: 90 tablet, Rfl: 3   isosorbide  mononitrate (IMDUR ) 60  MG 24 hr tablet, Take 1 tablet (60 mg total) by mouth daily., Disp: 90 tablet, Rfl: 3   MAGNESIUM PO, Take 2 tablets by mouth daily., Disp: , Rfl:    metoprolol  succinate (TOPROL -XL) 50 MG 24 hr tablet, Take 1 tablet (50 mg total) by mouth daily., Disp: 90 tablet, Rfl: 3   nitroGLYCERIN  (NITROSTAT ) 0.4 MG SL tablet, PLACE 1 TABLET UNDER THE TONGUE EVERY 5 MINUTES AS NEEDED FOR CHEST PAIN x 3 DOSES, THEN 911, Disp: 75 tablet, Rfl: 3   omeprazole  (PRILOSEC) 40 MG capsule, Take 1 capsule (40 mg total) by mouth daily., Disp: 90 capsule, Rfl: 1   rosuvastatin   (CRESTOR ) 40 MG tablet, Take 1 tablet (40 mg total) by mouth daily., Disp: 90 tablet, Rfl: 3  No Known Allergies ROS neg/noncontributory except as noted HPI/below      Objective:     BP 116/68 (BP Location: Left Arm, Patient Position: Sitting, Cuff Size: Normal)   Pulse 74   Temp (!) 97.2 F (36.2 C) (Temporal)   Ht 5' 8 (1.727 m)   Wt 187 lb 2 oz (84.9 kg)   SpO2 98%   BMI 28.45 kg/m  Wt Readings from Last 3 Encounters:  01/19/24 187 lb 2 oz (84.9 kg)  07/08/23 177 lb 4 oz (80.4 kg)  07/06/23 183 lb 3.2 oz (83.1 kg)    Physical Exam GENERAL: Well developed, well nourished, no acute distress. HEAD EYES EARS NOSE THROAT: Normocephalic, atraumatic, conjunctiva not injected, sclera nonicteric, ears without cerumen or visible abnormalities. CARDIAC: Regular rate and rhythm, S1 S2 present, no murmur, DP 2+B LUNGS: Clear to auscultation bilaterally, no wheezes.SABRA EXTREMITIES: No edema. MUSCULOSKELETAL: No gross abnormalities, musculoskeletal system normal. NEUROLOGICAL: Alert and oriented x3, cranial nerves II through XII intact. PSYCHIATRIC: Normal mood, good eye contact.   Results LABS Urine culture: Positive for E. coli (11/20/2023) Urine analysis: Negative (01/07/2024) Total cholesterol: 164 mg/dL (88/93/7974) LDL: 32.0 mg/dL (88/93/7974) Triglycerides: 262 mg/dL (88/93/7974) CA 19-9: 18 Testosterone: 3.4 ALT: 22 AST: 19 Creatinine: 1.1 Alkaline phosphatase: 85 Glucose: 101  DIAGNOSTIC Cystoscopy: Infection, white cells, crystals (12/29/2023)      Assessment & Plan:  Atherosclerosis of native coronary artery of native heart without angina pectoris -     Repatha SureClick; Inject 140 mg into the skin every 14 (fourteen) days.  Dispense: 6 mL; Refill: 3 -     VITAMIN D 25 Hydroxy (Vit-D Deficiency, Fractures); Future  Hyperlipidemia with target low density lipoprotein (LDL) cholesterol less than 70 mg/dL -     Repatha SureClick; Inject 140 mg into the skin  every 14 (fourteen) days.  Dispense: 6 mL; Refill: 3 -     Comprehensive metabolic panel with GFR; Future -     Lipid panel; Future -     VITAMIN D 25 Hydroxy (Vit-D Deficiency, Fractures); Future -     Magnesium; Future  Myalgia due to statin -     Repatha SureClick; Inject 140 mg into the skin every 14 (fourteen) days.  Dispense: 6 mL; Refill: 3 -     VITAMIN D 25 Hydroxy (Vit-D Deficiency, Fractures); Future -     Magnesium; Future  Abnormal TSH -     TSH; Future -     T3, free; Future -     T4, free; Future  Atrophy of thyroid  (acquired) -     TSH; Future -     VITAMIN D 25 Hydroxy (Vit-D Deficiency, Fractures); Future -     T3, free; Future -  T4, free; Future  Prediabetes -     Hemoglobin A1c; Future  High risk medication use -     VITAMIN D 25 Hydroxy (Vit-D Deficiency, Fractures); Future  Vitamin D deficiency, unspecified -     VITAMIN D 25 Hydroxy (Vit-D Deficiency, Fractures); Future    Assessment and Plan Assessment & Plan Atherosclerotic heart disease of native coronary artery   Coronary artery disease is managed with stents and angioplasty. He reports no current chest pain or dyspnea. LDL levels are well-controlled with rosuvastatin , but significant statin-induced myalgia is present. Rosuvastatin  has been discontinued due to myalgia, and Repatha (PCSK9 inhibitor) is initiated every two weeks. Monitor for adverse reactions to Repatha and reassess lipid levels in 2-3 months.  Has failed 2 statins and zetia   Hyperlipidemia with statin-induced myalgia   Hyperlipidemia is managed with rosuvastatin , which causes significant myalgia. While LDL levels are well-controlled, triglycerides have increased. Due to statin intolerance, alternative lipid-lowering therapies are necessary. Rosuvastatin  is discontinued, and Repatha (PCSK9 inhibitor) is initiated every two weeks. Monitor lipid levels and muscle symptoms. Has failed another statin in past as well  Prediabetes    Blood glucose level is 101 mg/dL with no current symptoms of diabetes.  Vitamin D deficiency   Vitamin D deficiency may contribute to myalgias. No recent vitamin D level check. Will check vitamin D levels.  Atrophy of thyroid  (acquired)   Acquired thyroid  atrophy is present      No follow-ups on file.  Jenkins CHRISTELLA Carrel, MD

## 2024-01-20 ENCOUNTER — Other Ambulatory Visit: Payer: Self-pay

## 2024-01-20 ENCOUNTER — Other Ambulatory Visit

## 2024-01-20 ENCOUNTER — Ambulatory Visit (INDEPENDENT_AMBULATORY_CARE_PROVIDER_SITE_OTHER)

## 2024-01-20 DIAGNOSIS — I2510 Atherosclerotic heart disease of native coronary artery without angina pectoris: Secondary | ICD-10-CM

## 2024-01-20 DIAGNOSIS — T466X5A Adverse effect of antihyperlipidemic and antiarteriosclerotic drugs, initial encounter: Secondary | ICD-10-CM

## 2024-01-20 DIAGNOSIS — R7303 Prediabetes: Secondary | ICD-10-CM

## 2024-01-20 DIAGNOSIS — E785 Hyperlipidemia, unspecified: Secondary | ICD-10-CM

## 2024-01-20 DIAGNOSIS — R7989 Other specified abnormal findings of blood chemistry: Secondary | ICD-10-CM | POA: Diagnosis not present

## 2024-01-20 DIAGNOSIS — M791 Myalgia, unspecified site: Secondary | ICD-10-CM | POA: Diagnosis not present

## 2024-01-20 DIAGNOSIS — E034 Atrophy of thyroid (acquired): Secondary | ICD-10-CM

## 2024-01-20 DIAGNOSIS — E559 Vitamin D deficiency, unspecified: Secondary | ICD-10-CM

## 2024-01-20 DIAGNOSIS — Z79899 Other long term (current) drug therapy: Secondary | ICD-10-CM

## 2024-01-20 LAB — LIPID PANEL
Cholesterol: 134 mg/dL (ref 0–200)
HDL: 54.5 mg/dL (ref >39.00)
LDL Cholesterol: 68 mg/dL (ref 0–99)
NonHDL: 79.59
Total CHOL/HDL Ratio: 2
Triglycerides: 59 mg/dL (ref 0.0–149.0)
VLDL: 11.8 mg/dL (ref 0.0–40.0)

## 2024-01-20 LAB — COMPREHENSIVE METABOLIC PANEL WITH GFR
ALT: 16 U/L (ref 0–53)
AST: 21 U/L (ref 0–37)
Albumin: 3.9 g/dL (ref 3.5–5.2)
Alkaline Phosphatase: 55 U/L (ref 39–117)
BUN: 13 mg/dL (ref 6–23)
CO2: 29 meq/L (ref 19–32)
Calcium: 8.6 mg/dL (ref 8.4–10.5)
Chloride: 107 meq/L (ref 96–112)
Creatinine, Ser: 0.89 mg/dL (ref 0.40–1.50)
GFR: 82.63 mL/min (ref >60.00)
Glucose, Bld: 100 mg/dL — ABNORMAL HIGH (ref 70–99)
Potassium: 4 meq/L (ref 3.5–5.1)
Sodium: 143 meq/L (ref 135–145)
Total Bilirubin: 0.5 mg/dL (ref 0.2–1.2)
Total Protein: 6.3 g/dL (ref 6.0–8.3)

## 2024-01-20 LAB — VITAMIN D 25 HYDROXY (VIT D DEFICIENCY, FRACTURES): VITD: 31.87 ng/mL (ref 30.00–100.00)

## 2024-01-20 LAB — CK: Total CK: 70 U/L (ref 17–232)

## 2024-01-20 LAB — T3, FREE: T3, Free: 3.7 pg/mL (ref 2.3–4.2)

## 2024-01-20 LAB — TSH: TSH: 5.03 u[IU]/mL (ref 0.35–5.50)

## 2024-01-20 LAB — MAGNESIUM: Magnesium: 1.8 mg/dL (ref 1.5–2.5)

## 2024-01-20 LAB — T4, FREE: Free T4: 0.89 ng/dL (ref 0.60–1.60)

## 2024-01-21 ENCOUNTER — Ambulatory Visit: Payer: Self-pay | Admitting: Family Medicine

## 2024-01-21 LAB — HEMOGLOBIN A1C: Hgb A1c MFr Bld: 5.4 % (ref 4.6–6.5)

## 2024-01-21 NOTE — Progress Notes (Signed)
 Labs ok-cpk neg.   Repeat lipids about 2-3 months after starting repatha(I sent the prior authorization in today)

## 2024-01-21 NOTE — Progress Notes (Signed)
 Called pt and notified smk

## 2024-01-25 NOTE — Telephone Encounter (Signed)
 Patient came in office stating that he was informed by [facility] that PCP needed to call so they can approve the prescription. Patient gave me the number, [phone] to call so this can be completed.

## 2024-01-26 NOTE — Progress Notes (Signed)
 We have sent in PA on Friday  [facility]

## 2024-02-03 ENCOUNTER — Telehealth: Payer: Self-pay

## 2024-02-03 ENCOUNTER — Other Ambulatory Visit (HOSPITAL_COMMUNITY): Payer: Self-pay

## 2024-02-03 NOTE — Telephone Encounter (Signed)
 Pharmacy Patient Advocate Encounter   Received notification from Physician's Office that prior authorization for Repatha  SureClick 140MG /ML auto-injectors  is required/requested.   Insurance verification completed.   The patient is insured through [employer].   Per test claim: PA required; PA submitted to above mentioned insurance via Latent Key/confirmation #/EOC [facility] Status is pending

## 2024-02-04 ENCOUNTER — Other Ambulatory Visit (HOSPITAL_COMMUNITY): Payer: Self-pay

## 2024-02-04 NOTE — Telephone Encounter (Signed)
 Pharmacy Patient Advocate Encounter  Received notification from [employer] that Prior Authorization for Repatha  SureClick 140MG /ML auto-injectors  has been APPROVED from 01/04/2024 to 02/02/2025. Ran test claim, Copay is $283.28. This test claim was processed through [facility]- copay amounts may vary at other pharmacies due to pharmacy/plan contracts, or as the patient moves through the different stages of their insurance plan.  90 day supply: $283.28 30 day supply: $101.30  PA #/Case ID/Reference #: [id number]

## 2024-02-05 ENCOUNTER — Other Ambulatory Visit: Payer: Self-pay | Admitting: Family

## 2024-02-05 DIAGNOSIS — M791 Myalgia, unspecified site: Secondary | ICD-10-CM

## 2024-02-05 DIAGNOSIS — E785 Hyperlipidemia, unspecified: Secondary | ICD-10-CM

## 2024-02-05 DIAGNOSIS — I2510 Atherosclerotic heart disease of native coronary artery without angina pectoris: Secondary | ICD-10-CM

## 2024-02-05 MED ORDER — REPATHA SURECLICK 140 MG/ML ~~LOC~~ SOAJ: 140.0000 mg | SUBCUTANEOUS | 3 refills | Status: AC

## 2024-02-05 NOTE — Telephone Encounter (Signed)
 Patient came in and states he was informed of medication approval via insurance. He states insurance told him to pick up from [facility] pharmacy but when he got there the medication wasn't there. I informed patient that it was [facility] to [facility] and he informed me that they do not deliver that medication in particular. Patient is requesting repatha  be sent to [facility] at [address] [place], [place] [address].

## 2024-02-05 NOTE — Telephone Encounter (Signed)
 Patient has been informed and verbalized understanding.

## 2024-07-08 ENCOUNTER — Ambulatory Visit: Admitting: Family Medicine
